# Patient Record
Sex: Female | Born: 1995 | Race: White | Hispanic: No | Marital: Single | Smoking: Current every day smoker
Health system: Southern US, Community
[De-identification: ages and names within clinical notes are randomized; demographics above are authoritative.]

## PROBLEM LIST (undated history)

## (undated) DIAGNOSIS — Z3A27 27 weeks gestation of pregnancy: Secondary | ICD-10-CM

## (undated) DIAGNOSIS — J302 Other seasonal allergic rhinitis: Secondary | ICD-10-CM

## (undated) DIAGNOSIS — F319 Bipolar disorder, unspecified: Secondary | ICD-10-CM

## (undated) DIAGNOSIS — F329 Major depressive disorder, single episode, unspecified: Secondary | ICD-10-CM

## (undated) DIAGNOSIS — F32A Depression, unspecified: Secondary | ICD-10-CM

## (undated) HISTORY — PX: MYRINGOTOMY: SUR874

## (undated) HISTORY — PX: NO PAST SURGERIES: SHX2092

---
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
## 2016-05-13 ENCOUNTER — Encounter: Payer: Self-pay | Admitting: Intensive Care

## 2016-05-13 ENCOUNTER — Emergency Department
Admission: EM | Admit: 2016-05-13 | Discharge: 2016-05-13 | Disposition: A | Discharge status: Home / Self Care | Payer: Medicaid Other | Attending: Emergency Medicine | Admitting: Emergency Medicine

## 2016-05-13 DIAGNOSIS — F17210 Nicotine dependence, cigarettes, uncomplicated: Secondary | ICD-10-CM | POA: Diagnosis not present

## 2016-05-13 DIAGNOSIS — N390 Urinary tract infection, site not specified: Secondary | ICD-10-CM | POA: Insufficient documentation

## 2016-05-13 DIAGNOSIS — R1031 Right lower quadrant pain: Secondary | ICD-10-CM

## 2016-05-13 LAB — COMPREHENSIVE METABOLIC PANEL
ALT: 15 U/L (ref 14–54)
ANION GAP: 6 (ref 5–15)
AST: 22 U/L (ref 15–41)
Albumin: 4.2 g/dL (ref 3.5–5.0)
Alkaline Phosphatase: 69 U/L (ref 38–126)
BUN: 14 mg/dL (ref 6–20)
CHLORIDE: 108 mmol/L (ref 101–111)
CO2: 26 mmol/L (ref 22–32)
Calcium: 9.4 mg/dL (ref 8.9–10.3)
Creatinine, Ser: 0.85 mg/dL (ref 0.44–1.00)
GFR calc Af Amer: >60 mL/min (ref >60)
Glucose, Bld: 88 mg/dL (ref 65–99)
POTASSIUM: 4 mmol/L (ref 3.5–5.1)
Sodium: 140 mmol/L (ref 135–145)
TOTAL PROTEIN: 7.3 g/dL (ref 6.5–8.1)
Total Bilirubin: 1 mg/dL (ref 0.3–1.2)

## 2016-05-13 LAB — URINALYSIS, COMPLETE (UACMP) WITH MICROSCOPIC
BILIRUBIN URINE: NEGATIVE
Glucose, UA: NEGATIVE mg/dL
Hgb urine dipstick: NEGATIVE
KETONES UR: NEGATIVE mg/dL
NITRITE: NEGATIVE
PROTEIN: NEGATIVE mg/dL
Specific Gravity, Urine: 1.027 (ref 1.005–1.030)
pH: 5 (ref 5.0–8.0)

## 2016-05-13 LAB — CBC
HCT: 42.6 % (ref 35.0–47.0)
HEMOGLOBIN: 14.7 g/dL (ref 12.0–16.0)
MCH: 32.9 pg (ref 26.0–34.0)
MCHC: 34.4 g/dL (ref 32.0–36.0)
MCV: 95.6 fL (ref 80.0–100.0)
Platelets: 217 10*3/uL (ref 150–440)
RBC: 4.45 MIL/uL (ref 3.80–5.20)
RDW: 12.8 % (ref 11.5–14.5)
WBC: 8.9 10*3/uL (ref 3.6–11.0)

## 2016-05-13 LAB — POCT PREGNANCY, URINE: PREG TEST UR: NEGATIVE

## 2016-05-13 LAB — LIPASE, BLOOD: LIPASE: 24 U/L (ref 11–51)

## 2016-05-13 MED ORDER — NITROFURANTOIN MONOHYD MACRO 100 MG PO CAPS: 100.0000 mg | ORAL_CAPSULE | Freq: Two times a day (BID) | ORAL | 0 refills | Status: AC

## 2016-05-13 NOTE — ED Notes (Signed)
Pt discharged home after verbalizing understanding of discharge instructions; nad noted. 

## 2016-05-13 NOTE — Discharge Instructions (Signed)
Please seek medical attention for any high fevers, chest pain, shortness of breath, change in behavior, persistent vomiting, bloody stool or any other new or concerning symptoms.  

## 2016-05-13 NOTE — ED Triage Notes (Signed)
Patient presents to ER with R lower abdominal pain. Pt reports she has HX of cyst in that area and believes it is the same pain as when she was first diagnosed with a cyst. Ambulatory in triage with no problems. No acute distress noted

## 2016-05-13 NOTE — ED Provider Notes (Signed)
[facility] Emergency Department Provider Note    ____________________________________________   I have reviewed the triage vital signs and the nursing notes.   HISTORY  Chief Complaint Abdominal Pain   History limited by: Not Limited   HPI Theresa Fischer is a 21 y.o. female who presents to the emergency department today cousins of concerns for right ovary pain. The patient states that it started about a week ago. Has been fairly constant but became severe today. It is located in her right lower quadrant. She has had some associated nausea. No change in urination. Patient states she thinks it's her ovary because it reminds her of the time she had an ovarian cyst roughly 3 years ago. The patient has not had any fevers.    Past Medical History:  Diagnosis Date  . Bipolar 1 disorder (HCC)   . Depression    Bipolar  . Seasonal allergies     Patient Active Problem List   Diagnosis Date Noted  . Postpartum care following vaginal delivery 02/16/2015  . Labor and delivery indication for care or intervention 02/12/2015  . Indication for care in labor and delivery, antepartum 02/12/2015  . Labor and delivery, indication for care 02/12/2015  . Abnormal first trimester screen 09/30/2014  . Seasonal allergies   . Depression 12/10/2011  . Suicidal thoughts 12/10/2011  . Substance abuse 12/10/2011    Past Surgical History:  Procedure Laterality Date  . MYRINGOTOMY    . NO PAST SURGERIES      Prior to Admission medications   Medication Sig Start Date End Date Taking? Authorizing Provider  baclofen (LIORESAL) 10 MG tablet Take 1 tablet (10 mg total) by mouth 3 (three) times daily. 08/20/15   Beers, Charmayne Sheerharles M, PA-C  butalbital-acetaminophen-caffeine (FIORICET) [phone] MG tablet Take 1-2 tablets by mouth every 6 (six) hours as needed for headache. 08/20/15   Beers, Charmayne Sheerharles M, PA-C  meloxicam (MOBIC) 15 MG tablet Take 1 tablet (15 mg total) by mouth daily.  08/20/15   Beers, Charmayne Sheerharles M, PA-C  Prenatal Vit-Fe Fumarate-FA (PRENATAL MULTIVITAMIN) TABS tablet Take 1 tablet by mouth daily at 12 noon.    [provider]    Allergies Patient has no known allergies.  Family History  Problem Relation Age of Onset  . CAD Mother     Social History Social History  Substance Use Topics  . Smoking status: Current Every Day Smoker    Packs/day: 0.10    Types: Cigarettes  . Smokeless tobacco: Never Used  . Alcohol use Yes     Comment: occ    Review of Systems Constitutional: No fever/chills Eyes: No visual changes. ENT: No sore throat. Cardiovascular: Denies chest pain. Respiratory: Denies shortness of breath. Gastrointestinal: Positive for right lower quadrant pain Genitourinary: Negative for dysuria. Musculoskeletal: Negative for back pain. Neurological: Negative for headaches, focal weakness or numbness.  ____________________________________________   PHYSICAL EXAM:  VITAL SIGNS: ED Triage Vitals  Enc Vitals Group     BP 05/13/16 1156 133/68     Pulse Rate 05/13/16 1156 92     Resp 05/13/16 1156 14     Temp 05/13/16 1156 97.9 F (36.6 C)     Temp Source 05/13/16 1156 Oral     SpO2 05/13/16 1156 99 %     Weight 05/13/16 1154 108 lb (49 kg)     Height 05/13/16 1154 5\' 2"  (1.575 m)     Head Circumference --      Peak Flow --  Pain Score 05/13/16 1154 9   Constitutional: Alert and oriented. Well appearing and in no distress. Eyes: Conjunctivae are normal. Normal extraocular movements. ENT   Head: Normocephalic and atraumatic.   Nose: No congestion/rhinnorhea.   Mouth/Throat: Mucous membranes are moist.   Neck: No stridor. Hematological/Lymphatic/Immunilogical: No cervical lymphadenopathy. Cardiovascular: Normal rate, regular rhythm.  No murmurs, rubs, or gallops.  Respiratory: Normal respiratory effort without tachypnea nor retractions. Breath sounds are clear and equal bilaterally. No  wheezes/rales/rhonchi. Gastrointestinal: Soft and tender to palpation in the right lower quadrant. No rebound. No guarding.  Genitourinary: Deferred Musculoskeletal: Normal range of motion in all extremities. No lower extremity edema. Neurologic:  Normal speech and language. No gross focal neurologic deficits are appreciated.  Skin:  Skin is warm, dry and intact. No rash noted. Psychiatric: Mood and affect are normal. Speech and behavior are normal. Patient exhibits appropriate insight and judgment.  ____________________________________________    LABS (pertinent positives/negatives)  Labs Reviewed  URINALYSIS, COMPLETE (UACMP) WITH MICROSCOPIC - Abnormal; Notable for the following:       Result Value   Color, Urine YELLOW (*)    APPearance CLOUDY (*)    Leukocytes, UA MODERATE (*)    Bacteria, UA RARE (*)    Squamous Epithelial / LPF 6-30 (*)    All other components within normal limits  LIPASE, BLOOD  COMPREHENSIVE METABOLIC PANEL  CBC  POC URINE PREG, ED  POCT PREGNANCY, URINE      ____________________________________________   EKG  None  ____________________________________________    RADIOLOGY  None   ____________________________________________   PROCEDURES  Procedures  ____________________________________________   INITIAL IMPRESSION / ASSESSMENT AND PLAN / ED COURSE  Pertinent labs & imaging results that were available during my care of the patient were reviewed by me and considered in my medical decision making (see chart for details).  Patient presented to the emergency department today because of concerns for right lower quadrant pain in relation to her ovary. Patient's blood work without any concerning findings. Urine is suggestive of a urinary tract infection. I did offer to obtain an ultrasound for the patient however she stated that she did not want to "spend all Mother's Day here." I splinted the patient that I cannot tell her it is a cyst  versus a different etiology like torsion without the ultrasound. Patient felt comfortable going home without imaging. At this point I would have low suspicion for torsion or tubo-ovarian abscess. Additionally I have low suspicion for appendicitis given lack of fever or leukocytosis. Will plan on treating patient for possible UTI. Did discuss return precautions. ____________________________________________   FINAL CLINICAL IMPRESSION(S) / ED DIAGNOSES  Final diagnoses:  Lower urinary tract infectious disease  Right lower quadrant abdominal pain     Note: This dictation was prepared with Dragon dictation. Any transcriptional errors that result from this process are unintentional     Phineas Semen, MD 05/13/16 1416

## 2016-05-13 NOTE — ED Notes (Signed)
EDP at bedside. Pt reports right sided abdominal pain x 1 week. She states that she had an ovarian cyst a few years ago and this feels similar. Pt denies urinary symptoms. She also declines ultrasound because "I'm not trying to spend my whole mother's day here." Pt agrees to take antibiotics for uti and be discharged.

## 2016-07-02 ENCOUNTER — Ambulatory Visit (INDEPENDENT_AMBULATORY_CARE_PROVIDER_SITE_OTHER): Payer: Medicaid Other | Admitting: Advanced Practice Midwife

## 2016-07-02 ENCOUNTER — Encounter: Payer: Self-pay | Admitting: Advanced Practice Midwife

## 2016-07-02 VITALS — BP 110/70 | Ht 62.0 in | Wt 118.0 lb

## 2016-07-02 DIAGNOSIS — M25551 Pain in right hip: Secondary | ICD-10-CM

## 2016-07-02 DIAGNOSIS — Z01419 Encounter for gynecological examination (general) (routine) without abnormal findings: Secondary | ICD-10-CM

## 2016-07-02 DIAGNOSIS — Z3042 Encounter for surveillance of injectable contraceptive: Secondary | ICD-10-CM

## 2016-07-02 DIAGNOSIS — Z0000 Encounter for general adult medical examination without abnormal findings: Secondary | ICD-10-CM

## 2016-07-02 MED ORDER — MEDROXYPROGESTERONE ACETATE 150 MG/ML IM SUSP: 150.0000 mg | INTRAMUSCULAR | 3 refills | Status: DC

## 2016-07-02 NOTE — Progress Notes (Signed)
Patient ID: Barbaraann Share, female   DOB: 1995/04/01, 21 y.o.   MRN: [id number]     Gynecology Annual Exam  PCP: [place] Bing, MD  Chief Complaint:  Chief Complaint  Patient presents with  . Annual Exam    History of Present Illness: Patient is a 21 y.o. [id number] presents for annual exam. The patient has complaints today of RLQ pain that began in the past year and is constant. The pain is worse with intercourse. She thinks the pain is focused on her right ovary. She is not currently taking any medicine for anxiety or depression but she is seeing a therapist at [facility] given her history of depression.  LMP: Patient's last menstrual period was 06/25/2016. Menarche:not applicable Average Interval: irregular, not applicable days. Has had 1 period since being on Depo Duration of flow: 14 days Heavy Menses: yes Clots: no Intermenstrual Bleeding: no Postcoital Bleeding: no Dysmenorrhea: no  The patient is sexually active. She currently uses Depo-Provera injections for contraception. She admits to dyspareunia.  The patient does perform self breast exams.  There is no notable family history of breast or ovarian cancer in her family.  The patient wears seatbelts: yes.  The patient has regular exercise: yes.    The patient denies current symptoms of depression.    Review of Systems: Review of Systems  Constitutional: Negative.   HENT: Negative.   Eyes: Negative.   Respiratory: Negative.   Cardiovascular: Negative.   Gastrointestinal: Negative.   Genitourinary: Negative.   Musculoskeletal: Negative.   Skin: Negative.   Neurological: Negative.   Endo/Heme/Allergies: Negative.   Psychiatric/Behavioral: Negative.     Past Medical History:  Past Medical History:  Diagnosis Date  . Bipolar 1 disorder (HCC)   . Depression    Bipolar  . Seasonal allergies     Past Surgical History:  Past Surgical History:  Procedure Laterality Date  . MYRINGOTOMY    . NO PAST SURGERIES       Gynecologic History:  Patient's last menstrual period was 06/25/2016. Contraception: Depo-Provera injections Last Pap: Results were: Has not yet had a PAP  Obstetric History: [id number]  Family History:  Family History  Problem Relation Age of Onset  . CAD Mother     Social History:  Social History   Social History  . Marital status: Single    Spouse name: N/A  . Number of children: N/A  . Years of education: N/A   Occupational History  . Not on file.   Social History Main Topics  . Smoking status: Current Every Day Smoker    Packs/day: 0.10    Types: Cigarettes  . Smokeless tobacco: Never Used  . Alcohol use Yes     Comment: occ  . Drug use: Yes    Types: Marijuana     Comment: Last use August 2016  . Sexual activity: Yes    Birth control/ protection: Injection     Comment: depo   Other Topics Concern  . Not on file   Social History Narrative  . No narrative on file    Allergies:  No Known Allergies  Medications: Prior to Admission medications   Medication Sig Start Date End Date Taking? Authorizing Provider  medroxyPROGESTERone (DEPO-PROVERA) 150 MG/ML injection Inject 1 mL (150 mg total) into the muscle every 3 (three) months. 07/02/16  Yes Tresea Mall, CNM  baclofen (LIORESAL) 10 MG tablet Take 1 tablet (10 mg total) by mouth 3 (three) times daily. Patient not taking: Reported on 07/02/2016  08/20/15   Beers, Charmayne Sheerharles M, PA-C  butalbital-acetaminophen-caffeine (FIORICET) [phone] MG tablet Take 1-2 tablets by mouth every 6 (six) hours as needed for headache. Patient not taking: Reported on 07/02/2016 08/20/15   Beers, Charmayne Sheerharles M, PA-C  meloxicam (MOBIC) 15 MG tablet Take 1 tablet (15 mg total) by mouth daily. Patient not taking: Reported on 07/02/2016 08/20/15   Beers, Charmayne Sheerharles M, PA-C  Prenatal Vit-Fe Fumarate-FA (PRENATAL MULTIVITAMIN) TABS tablet Take 1 tablet by mouth daily at 12 noon.    [provider]    Physical Exam Vitals: Blood pressure  110/70, height 5\' 2"  (1.575 m), weight 118 lb (53.5 kg), last menstrual period 06/25/2016, not currently breastfeeding.  General: NAD HEENT: normocephalic, anicteric Thyroid: no enlargement, no palpable nodules Pulmonary: No increased work of breathing, CTAB Cardiovascular: RRR, distal pulses 2+ Breast: Breast symmetrical, no tenderness, no palpable nodules or masses, no skin or nipple retraction present, no nipple discharge.  No axillary or supraclavicular lymphadenopathy. Abdomen: NABS, soft, non-tender, non-distended.  Umbilicus without lesions.  No hepatomegaly, splenomegaly or masses palpable. No evidence of hernia  Genitourinary:  External: Normal external female genitalia.  Normal urethral meatus, normal  Bartholin's and Skene's glands.    Vagina: Normal vaginal mucosa, no evidence of prolapse.    Cervix: Grossly normal in appearance, no bleeding, no CMT  Uterus: Non-enlarged, mobile, normal contour.    Adnexa: ovaries non-enlarged, no adnexal masses, increased tenderness on R side  Rectal: deferred  Lymphatic: no evidence of inguinal lymphadenopathy Extremities: no edema, erythema, or tenderness Neurologic: Grossly intact Psychiatric: mood appropriate, affect full   Assessment: 21 y.o. [id number] Well woman exam Plan: Problem List Items Addressed This Visit    None    Visit Diagnoses    Pain in joint involving right pelvic region and thigh    -  Primary   Relevant Orders   US Transvaginal Non-OB   Encounter for surveillance of injectable contraceptive       Relevant Medications   medroxyPROGESTERone (DEPO-PROVERA) 150 MG/ML injection   Well woman exam with routine gynecological exam          1) 4) Gardasil Series discussed and if applicable offered to patient - Patient has not previously completed 3 shot series and requests vaccination  2) STI screening was offered and declined   3) ASCCP guidelines and rational discussed.  Patient opts for beginning at age 21  screening interval  4) Contraception - Education given regarding options for contraception. Patient prefers to continue with Depo for now but is considering Nexplanon   5) Follow up gyn u/s for RLQ pain   Tresea MallJane Jovanni Rash, CNM

## 2016-07-10 ENCOUNTER — Other Ambulatory Visit: Payer: Medicaid Other

## 2016-07-10 ENCOUNTER — Ambulatory Visit: Payer: Medicaid Other | Admitting: Advanced Practice Midwife

## 2016-09-02 ENCOUNTER — Encounter: Payer: Self-pay | Admitting: Emergency Medicine

## 2016-09-02 DIAGNOSIS — N390 Urinary tract infection, site not specified: Secondary | ICD-10-CM | POA: Diagnosis not present

## 2016-09-02 DIAGNOSIS — B9689 Other specified bacterial agents as the cause of diseases classified elsewhere: Secondary | ICD-10-CM | POA: Diagnosis not present

## 2016-09-02 DIAGNOSIS — R102 Pelvic and perineal pain: Secondary | ICD-10-CM | POA: Insufficient documentation

## 2016-09-02 DIAGNOSIS — F17210 Nicotine dependence, cigarettes, uncomplicated: Secondary | ICD-10-CM | POA: Diagnosis not present

## 2016-09-02 DIAGNOSIS — N760 Acute vaginitis: Secondary | ICD-10-CM | POA: Insufficient documentation

## 2016-09-02 LAB — COMPREHENSIVE METABOLIC PANEL
ALBUMIN: 4.4 g/dL (ref 3.5–5.0)
ALT: 14 U/L (ref 14–54)
AST: 19 U/L (ref 15–41)
Alkaline Phosphatase: 71 U/L (ref 38–126)
Anion gap: 7 (ref 5–15)
BUN: 13 mg/dL (ref 6–20)
CHLORIDE: 105 mmol/L (ref 101–111)
CO2: 27 mmol/L (ref 22–32)
CREATININE: 0.71 mg/dL (ref 0.44–1.00)
Calcium: 9.3 mg/dL (ref 8.9–10.3)
GFR calc Af Amer: >60 mL/min (ref >60)
GFR calc non Af Amer: >60 mL/min (ref >60)
GLUCOSE: 78 mg/dL (ref 65–99)
Potassium: 3.9 mmol/L (ref 3.5–5.1)
SODIUM: 139 mmol/L (ref 135–145)
Total Bilirubin: 0.7 mg/dL (ref 0.3–1.2)
Total Protein: 7.4 g/dL (ref 6.5–8.1)

## 2016-09-02 LAB — URINALYSIS, COMPLETE (UACMP) WITH MICROSCOPIC
BACTERIA UA: NONE SEEN
BILIRUBIN URINE: NEGATIVE
GLUCOSE, UA: NEGATIVE mg/dL
Hgb urine dipstick: NEGATIVE
KETONES UR: NEGATIVE mg/dL
NITRITE: NEGATIVE
PH: 6 (ref 5.0–8.0)
PROTEIN: NEGATIVE mg/dL
Specific Gravity, Urine: 1.018 (ref 1.005–1.030)

## 2016-09-02 LAB — CBC
HCT: 41.7 % (ref 35.0–47.0)
Hemoglobin: 14.6 g/dL (ref 12.0–16.0)
MCH: 33.7 pg (ref 26.0–34.0)
MCHC: 35 g/dL (ref 32.0–36.0)
MCV: 96.4 fL (ref 80.0–100.0)
Platelets: 206 10*3/uL (ref 150–440)
RBC: 4.33 MIL/uL (ref 3.80–5.20)
RDW: 12.5 % (ref 11.5–14.5)
WBC: 7.9 10*3/uL (ref 3.6–11.0)

## 2016-09-02 LAB — POCT PREGNANCY, URINE: Preg Test, Ur: NEGATIVE

## 2016-09-02 LAB — LIPASE, BLOOD: LIPASE: 27 U/L (ref 11–51)

## 2016-09-02 NOTE — ED Triage Notes (Signed)
Patient with complaint of right lower abdominal pain that started 2 days ago. Patient states that she has some nausea but denies vomiting.

## 2016-09-03 ENCOUNTER — Emergency Department
Admission: EM | Admit: 2016-09-03 | Discharge: 2016-09-03 | Disposition: A | Discharge status: Home / Self Care | Payer: Medicaid Other | Attending: Emergency Medicine | Admitting: Emergency Medicine

## 2016-09-03 ENCOUNTER — Emergency Department: Payer: Medicaid Other

## 2016-09-03 DIAGNOSIS — B9689 Other specified bacterial agents as the cause of diseases classified elsewhere: Secondary | ICD-10-CM

## 2016-09-03 DIAGNOSIS — N760 Acute vaginitis: Secondary | ICD-10-CM

## 2016-09-03 DIAGNOSIS — R52 Pain, unspecified: Secondary | ICD-10-CM

## 2016-09-03 DIAGNOSIS — N390 Urinary tract infection, site not specified: Secondary | ICD-10-CM

## 2016-09-03 DIAGNOSIS — R102 Pelvic and perineal pain: Secondary | ICD-10-CM

## 2016-09-03 LAB — CHLAMYDIA/NGC RT PCR (ARMC ONLY)
CHLAMYDIA TR: NOT DETECTED
N GONORRHOEAE: NOT DETECTED

## 2016-09-03 LAB — WET PREP, GENITAL
Sperm: NONE SEEN
Trich, Wet Prep: NONE SEEN
Yeast Wet Prep HPF POC: NONE SEEN

## 2016-09-03 IMAGING — US US PELVIS COMPLETE
1 series · 14 of 25 positions shown · non-contrast
Comparison: None.

CLINICAL DATA: RIGHT ADNEXAL PAIN FOR 2 DAYS

EXAM:
TRANSABDOMINAL AND TRANSVAGINAL ULTRASOUND OF PELVIS
DOPPLER ULTRASOUND OF OVARIES
TECHNIQUE: Both transabdominal and transvaginal ultrasound examinations of the
pelvis were performed. Transabdominal technique was performed for
global imaging of the pelvis including uterus, ovaries, adnexal
regions, and pelvic cul-de-sac.
It was necessary to proceed with endovaginal exam following the
transabdominal exam to visualize the ovaries. Color and duplex
Doppler ultrasound was utilized to evaluate blood flow to the
ovaries.

[Series 1: us pelvis complete · 0.24mm/px · 14 of 92 slices shown]
[im 1/92]
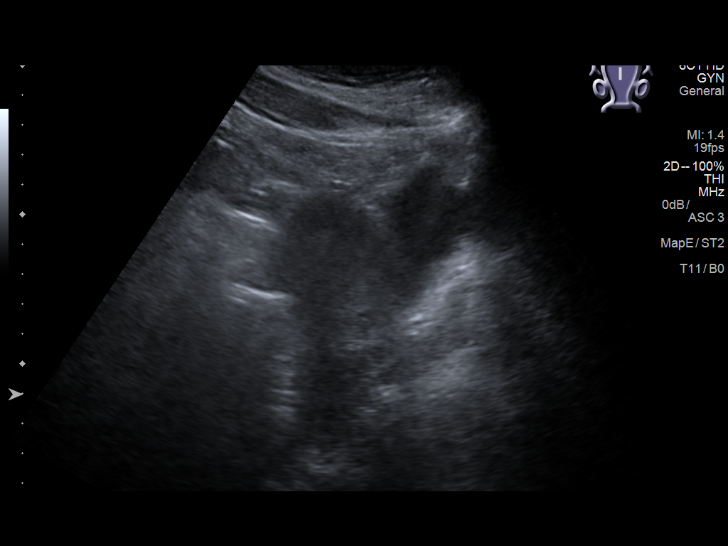
[im 8/92]
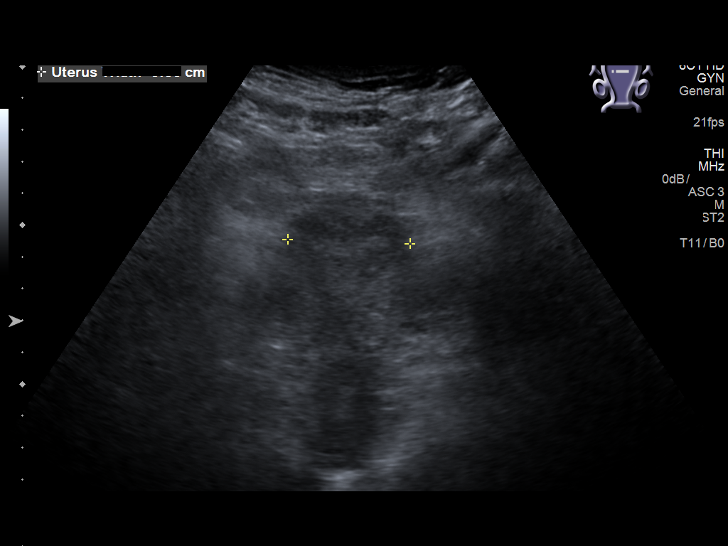
[im 16/92]
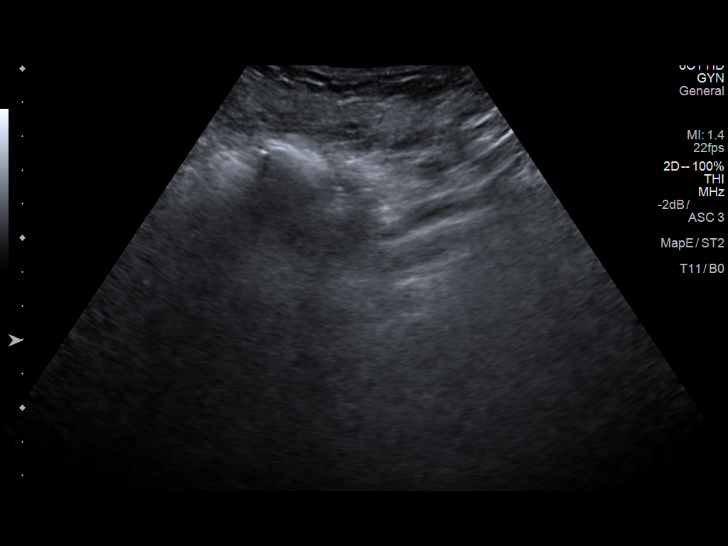
[im 23/92]
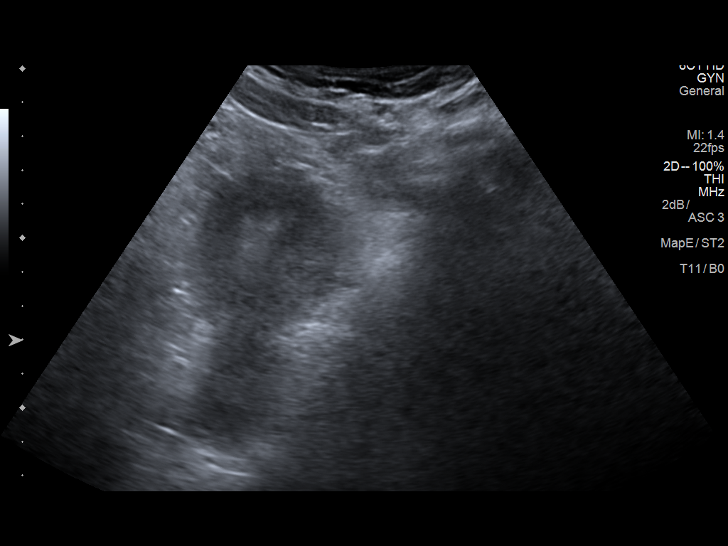
[im 31/92]
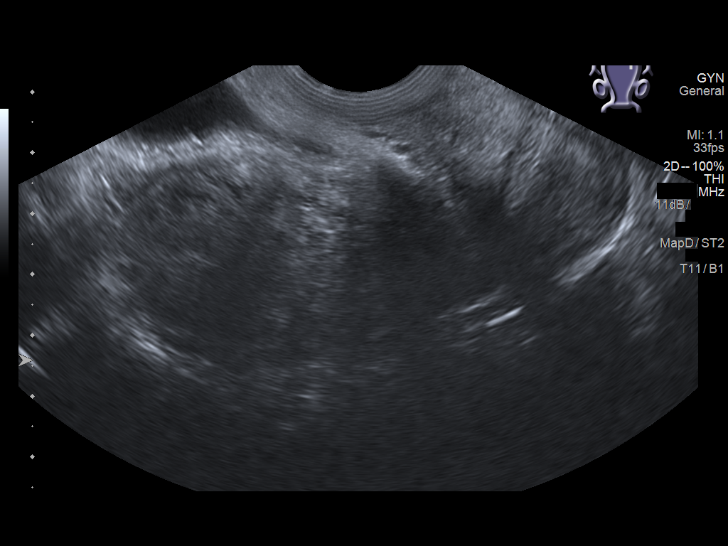
[im 35/92]
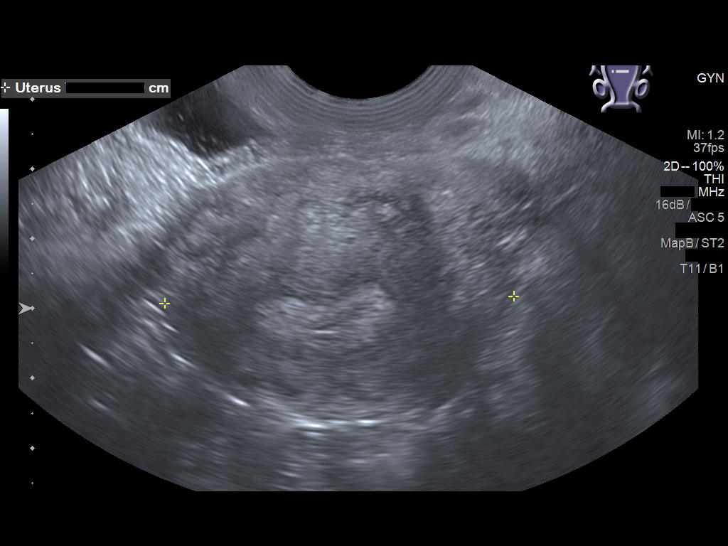
[im 42/92]
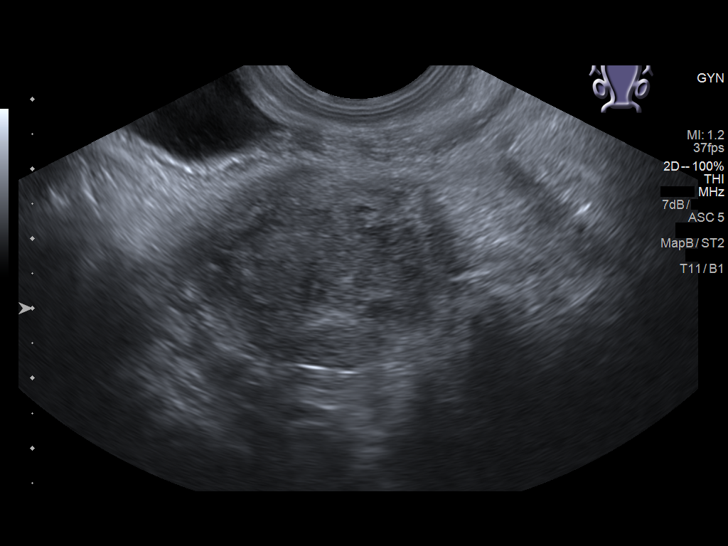
[im 50/92]
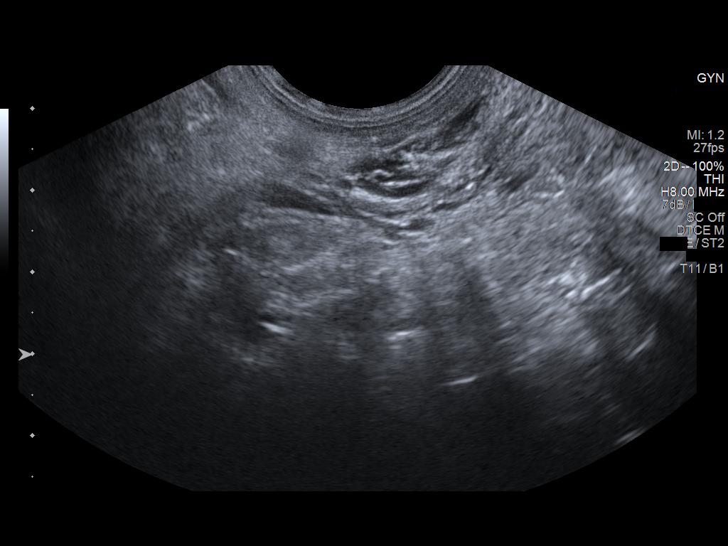
[im 57/92]
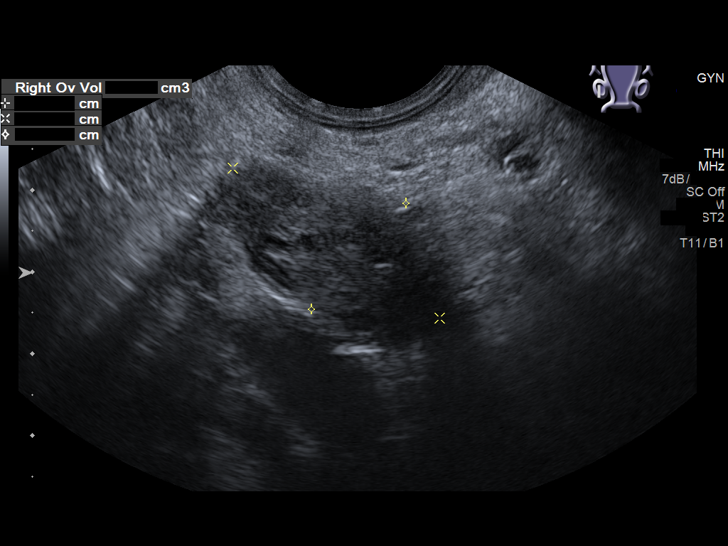
[im 61/92]
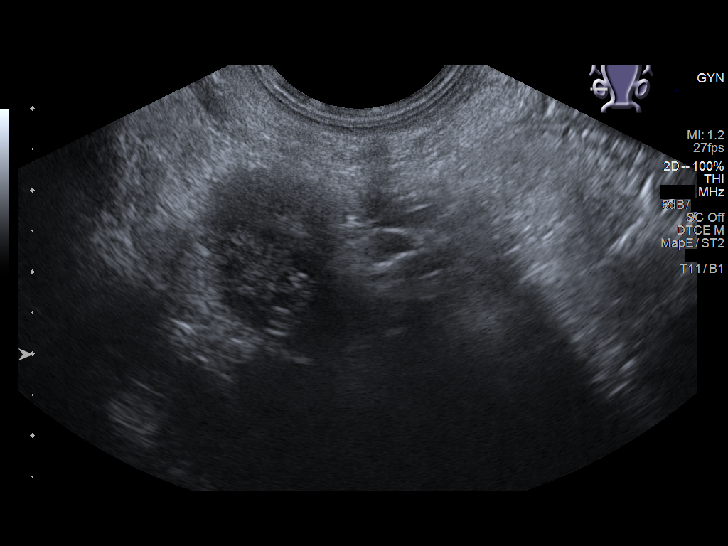
[im 69/92]
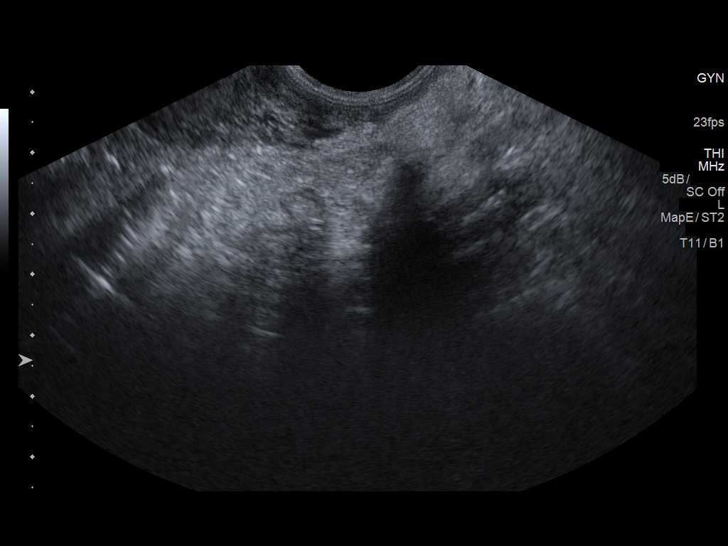
[im 76/92]
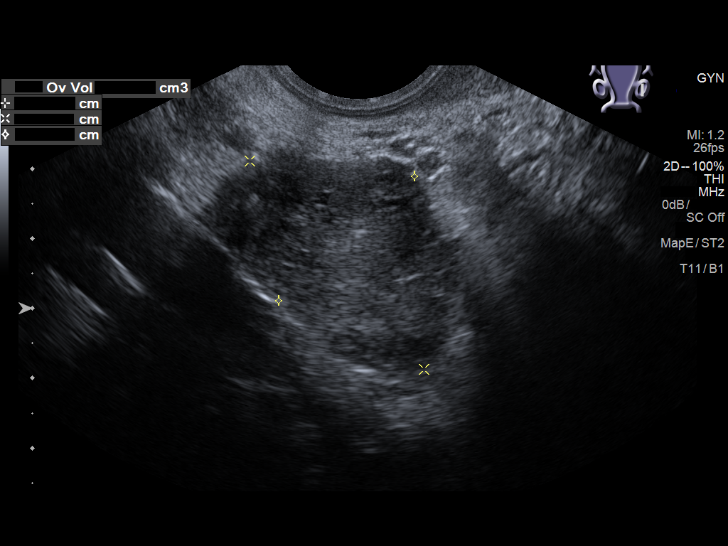
[im 84/92]
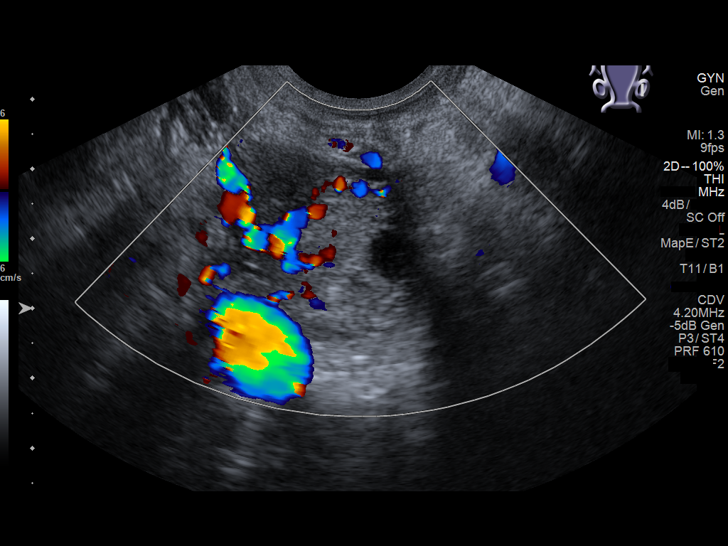
[im 92/92]
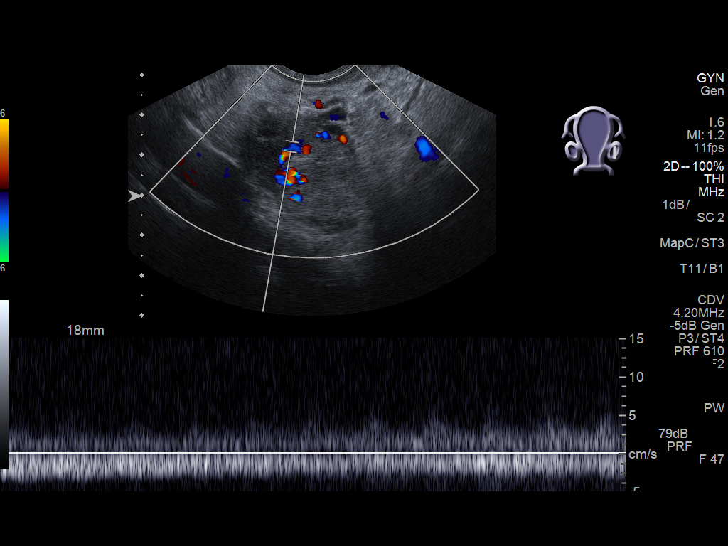

[14 of 25 positions shown; findings below may reference images not displayed]

FINDINGS: Uterus

Measurements: 7.5 x 3.8 x 5.0 cm. No fibroids or other mass
visualized.

Endometrium

Thickness: 6.8 mm.  No focal abnormality visualized.

Right ovary

Measurements: 3.1 x 1.7 x 1.5 cm. Normal appearance/no adnexal mass.

Left ovary

Measurements: 3.9 x 2.6 x 2.2 cm. Normal appearance/no adnexal mass.

Pulsed Doppler evaluation of both ovaries demonstrates normal
low-resistance arterial and venous waveforms.

Other findings

Trace free pelvic fluid.
IMPRESSION: Normal uterus and ovaries.  Intact ovarian perfusion on Doppler.

## 2016-09-03 MED ORDER — METRONIDAZOLE 500 MG PO TABS: 500.0000 mg | ORAL_TABLET | Freq: Two times a day (BID) | ORAL | 0 refills | Status: DC

## 2016-09-03 MED ORDER — FOSFOMYCIN TROMETHAMINE 3 G PO PACK
3.0000 g | PACK | Freq: Once | ORAL | Status: AC
  Administered 2016-09-03: 3 g via ORAL
  Filled 2016-09-03: qty 3

## 2016-09-03 NOTE — ED Notes (Signed)
Pt back to room from us.

## 2016-09-03 NOTE — Discharge Instructions (Signed)
1. You were given a 1 time dose of antibiotic in the emergency department for your UTI. 2. Take antibiotic as prescribed (Flagyl 500 mg twice daily 7 days). 3. Return to the ER for worsening symptoms, persistent vomiting, fever or other concerns.

## 2016-09-03 NOTE — ED Notes (Signed)
Pt taken to us.

## 2016-09-03 NOTE — ED Provider Notes (Signed)
[facility] Emergency Department Provider Note   ____________________________________________   First MD Initiated Contact with Patient 09/03/16 0041     (approximate)  I have reviewed the triage vital signs and the nursing notes.   HISTORY  Chief Complaint Abdominal Pain    HPI Theresa Fischer is a 21 y.o. female who presents to the ED from home with a chief complaint of right pelvic pain. States it has been going on for "a while". When pressed for time frame, patient thinks it has been over a week. Describes pressure-like sensation which is nonradiating and mostly at nighttime. Notes vaginal discharge starting yesterday. Last Depakote shot due on July 5 which patient did not receive. Denies associated fever, chills, chest pain, shortness of breath, abdominal pain, nausea, vomiting, dysuria, diarrhea. Deniesrecent travel or trauma. Patient has not taken anything for her pain.   Past Medical History:  Diagnosis Date  . Bipolar 1 disorder (HCC)   . Depression    Bipolar  . Seasonal allergies   right ovarian cyst  Patient Active Problem List   Diagnosis Date Noted  . Seasonal allergies   . Depression 12/10/2011  . Suicidal thoughts 12/10/2011  . Substance abuse 12/10/2011    Past Surgical History:  Procedure Laterality Date  . MYRINGOTOMY    . NO PAST SURGERIES      Prior to Admission medications   Medication Sig Start Date End Date Taking? Authorizing Provider  baclofen (LIORESAL) 10 MG tablet Take 1 tablet (10 mg total) by mouth 3 (three) times daily. Patient not taking: Reported on 07/02/2016 08/20/15   Beers, Charmayne Sheer, PA-C  butalbital-acetaminophen-caffeine (FIORICET) [phone] MG tablet Take 1-2 tablets by mouth every 6 (six) hours as needed for headache. Patient not taking: Reported on 07/02/2016 08/20/15   Evangeline Dakin, PA-C  medroxyPROGESTERone (DEPO-PROVERA) 150 MG/ML injection Inject 1 mL (150 mg total) into the muscle every 3  (three) months. 07/02/16   Tresea Mall, CNM  meloxicam (MOBIC) 15 MG tablet Take 1 tablet (15 mg total) by mouth daily. Patient not taking: Reported on 07/02/2016 08/20/15   Evangeline Dakin, PA-C  metroNIDAZOLE (FLAGYL) 500 MG tablet Take 1 tablet (500 mg total) by mouth 2 (two) times daily. 09/03/16   Irean Hong, MD  Prenatal Vit-Fe Fumarate-FA (PRENATAL MULTIVITAMIN) TABS tablet Take 1 tablet by mouth daily at 12 noon.    [provider]    Allergies Patient has no known allergies.  Family History  Problem Relation Age of Onset  . CAD Mother     Social History Social History  Substance Use Topics  . Smoking status: Current Every Day Smoker    Packs/day: 0.10    Types: Cigarettes  . Smokeless tobacco: Never Used  . Alcohol use Yes     Comment: occ    Review of Systems  Constitutional: No fever/chills. Eyes: No visual changes. ENT: No sore throat. Cardiovascular: Denies chest pain. Respiratory: Denies shortness of breath. Gastrointestinal: positive for right pelvic pain. No abdominal pain.  No nausea, no vomiting.  No diarrhea.  No constipation. Genitourinary: Negative for dysuria. Musculoskeletal: Negative for back pain. Skin: Negative for rash. Neurological: Negative for headaches, focal weakness or numbness.   ____________________________________________   PHYSICAL EXAM:  VITAL SIGNS: ED Triage Vitals [09/02/16 2327]  Enc Vitals Group     BP 129/66     Pulse Rate 91     Resp 18     Temp 98 F (36.7 C)  Temp src      SpO2 100 %     Weight 128 lb (58.1 kg)     Height 5\' 3"  (1.6 m)     Head Circumference      Peak Flow      Pain Score 5     Pain Loc      Pain Edu?      Excl. in GC?     Constitutional: Alert and oriented. Well appearing and in no acute distress. Eyes: Conjunctivae are normal. PERRL. EOMI. Head: Atraumatic. Nose: No congestion/rhinnorhea. Mouth/Throat: Mucous membranes are moist.  Oropharynx non-erythematous. Neck: No  stridor.   Cardiovascular: Normal rate, regular rhythm. Grossly normal heart sounds.  Good peripheral circulation. Respiratory: Normal respiratory effort.  No retractions. Lungs CTAB. Gastrointestinal: Soft and nontender to light or deep palpation. No tenderness at McBurney's point. No distention. No abdominal bruits. No CVA tenderness. Musculoskeletal: No lower extremity tenderness nor edema.  No joint effusions. Neurologic:  Normal speech and language. No gross focal neurologic deficits are appreciated. No gait instability. Skin:  Skin is warm, dry and intact. No rash noted. Psychiatric: Mood and affect are normal. Speech and behavior are normal.  ____________________________________________   LABS (all labs ordered are listed, but only abnormal results are displayed)  Labs Reviewed  WET PREP, GENITAL - Abnormal; Notable for the following:       Result Value   Clue Cells Wet Prep HPF POC PRESENT (*)    WBC, Wet Prep HPF POC MANY (*)    All other components within normal limits  URINALYSIS, COMPLETE (UACMP) WITH MICROSCOPIC - Abnormal; Notable for the following:    Color, Urine YELLOW (*)    APPearance CLEAR (*)    Leukocytes, UA TRACE (*)    Squamous Epithelial / LPF 0-5 (*)    All other components within normal limits  CHLAMYDIA/NGC RT PCR ([facility] ONLY)  LIPASE, BLOOD  COMPREHENSIVE METABOLIC PANEL  CBC  POC URINE PREG, ED  POCT PREGNANCY, URINE   ____________________________________________  EKG  none ____________________________________________  RADIOLOGY  US Pelvis Transvanginal Non-ob (tv Only)  Result Date: 09/03/2016 CLINICAL DATA:  RIGHT ADNEXAL PAIN FOR 2 DAYS EXAM: TRANSABDOMINAL AND TRANSVAGINAL ULTRASOUND OF PELVIS DOPPLER ULTRASOUND OF OVARIES TECHNIQUE: Both transabdominal and transvaginal ultrasound examinations of the pelvis were performed. Transabdominal technique was performed for global imaging of the pelvis including uterus, ovaries, adnexal  regions, and pelvic cul-de-sac. It was necessary to proceed with endovaginal exam following the transabdominal exam to visualize the ovaries. Color and duplex Doppler ultrasound was utilized to evaluate blood flow to the ovaries. COMPARISON:  None. FINDINGS: Uterus Measurements: 7.5 x 3.8 x 5.0 cm. No fibroids or other mass visualized. Endometrium Thickness: 6.8 mm.  No focal abnormality visualized. Right ovary Measurements: 3.1 x 1.7 x 1.5 cm. Normal appearance/no adnexal mass. Left ovary Measurements: 3.9 x 2.6 x 2.2 cm. Normal appearance/no adnexal mass. Pulsed Doppler evaluation of both ovaries demonstrates normal low-resistance arterial and venous waveforms. Other findings Trace free pelvic fluid. IMPRESSION: Normal uterus and ovaries.  Intact ovarian perfusion on Doppler. Electronically Signed   By: Ellery Plunk M.D.   On: 09/03/2016 02:26   US Pelvis Complete  Result Date: 09/03/2016 CLINICAL DATA:  RIGHT ADNEXAL PAIN FOR 2 DAYS EXAM: TRANSABDOMINAL AND TRANSVAGINAL ULTRASOUND OF PELVIS DOPPLER ULTRASOUND OF OVARIES TECHNIQUE: Both transabdominal and transvaginal ultrasound examinations of the pelvis were performed. Transabdominal technique was performed for global imaging of the pelvis including uterus, ovaries, adnexal regions, and pelvic  cul-de-sac. It was necessary to proceed with endovaginal exam following the transabdominal exam to visualize the ovaries. Color and duplex Doppler ultrasound was utilized to evaluate blood flow to the ovaries. COMPARISON:  None. FINDINGS: Uterus Measurements: 7.5 x 3.8 x 5.0 cm. No fibroids or other mass visualized. Endometrium Thickness: 6.8 mm.  No focal abnormality visualized. Right ovary Measurements: 3.1 x 1.7 x 1.5 cm. Normal appearance/no adnexal mass. Left ovary Measurements: 3.9 x 2.6 x 2.2 cm. Normal appearance/no adnexal mass. Pulsed Doppler evaluation of both ovaries demonstrates normal low-resistance arterial and venous waveforms. Other findings Trace  free pelvic fluid. IMPRESSION: Normal uterus and ovaries.  Intact ovarian perfusion on Doppler. Electronically Signed   By: Ellery Plunk M.D.   On: 09/03/2016 02:26   US Pelvic Doppler (torsion R/o Or Mass Arterial Flow)  Result Date: 09/03/2016 CLINICAL DATA:  RIGHT ADNEXAL PAIN FOR 2 DAYS EXAM: TRANSABDOMINAL AND TRANSVAGINAL ULTRASOUND OF PELVIS DOPPLER ULTRASOUND OF OVARIES TECHNIQUE: Both transabdominal and transvaginal ultrasound examinations of the pelvis were performed. Transabdominal technique was performed for global imaging of the pelvis including uterus, ovaries, adnexal regions, and pelvic cul-de-sac. It was necessary to proceed with endovaginal exam following the transabdominal exam to visualize the ovaries. Color and duplex Doppler ultrasound was utilized to evaluate blood flow to the ovaries. COMPARISON:  None. FINDINGS: Uterus Measurements: 7.5 x 3.8 x 5.0 cm. No fibroids or other mass visualized. Endometrium Thickness: 6.8 mm.  No focal abnormality visualized. Right ovary Measurements: 3.1 x 1.7 x 1.5 cm. Normal appearance/no adnexal mass. Left ovary Measurements: 3.9 x 2.6 x 2.2 cm. Normal appearance/no adnexal mass. Pulsed Doppler evaluation of both ovaries demonstrates normal low-resistance arterial and venous waveforms. Other findings Trace free pelvic fluid. IMPRESSION: Normal uterus and ovaries.  Intact ovarian perfusion on Doppler. Electronically Signed   By: Ellery Plunk M.D.   On: 09/03/2016 02:26    ____________________________________________   PROCEDURES  Procedure(s) performed:   Pelvic exam: External exam WNL without rashes, lesions or vesicles. Speculum exam reveals mild white discharge. Cervix WNL. Bimanual exam with mild cervical tenderness to palpation.  Procedures  Critical Care performed: No  ____________________________________________   INITIAL IMPRESSION / ASSESSMENT AND PLAN / ED COURSE  Pertinent labs & imaging results that were available  during my care of the patient were reviewed by me and considered in my medical decision making (see chart for details).  21 year old female who presents with right pelvic pain for over a week. History of right ovarian cyst.no fever, vomiting or anorexia. Normal white count. Trace leukocytes on urinalysis. Will obtain wet prep/DNA swabs and proceed with pelvic ultrasound.  Clinical Course as of Sep 04 255  Mon Sep 03, 2016  0254 updated patient of wet prep and ultrasound results. DNA negative. We'll discharge home on Flagyl and patient will follow-up with GYN. Strict return precautions given. Patient verbalizes understanding and agrees with plan of care.  [JS]    Clinical Course User Index [JS] Irean Hong, MD     ____________________________________________   FINAL CLINICAL IMPRESSION(S) / ED DIAGNOSES  Final diagnoses:  Pain  Pelvic pain in female  Lower urinary tract infectious disease  Bacterial vaginosis      NEW MEDICATIONS STARTED DURING THIS VISIT:  New Prescriptions   METRONIDAZOLE (FLAGYL) 500 MG TABLET    Take 1 tablet (500 mg total) by mouth 2 (two) times daily.     Note:  This document was prepared using Dragon voice recognition software and may include unintentional dictation  errors.    Irean HongSung, Deryk Bozman J, MD 09/03/16 [phone]

## 2016-09-03 NOTE — ED Notes (Signed)
Pt said she started having right lower abd pain since last week, denies any diarrhea or constipation, stated had BM today and it was regular. Denies any vaginal discharges. Stated unknown LMP, had her last depo injection July 5th.

## 2016-09-24 ENCOUNTER — Other Ambulatory Visit: Payer: Self-pay | Admitting: Family Medicine

## 2016-09-24 ENCOUNTER — Ambulatory Visit
Admission: RE | Admit: 2016-09-24 | Discharge: 2016-09-24 | Disposition: A | Discharge status: Home / Self Care | Payer: Medicaid Other | Source: Ambulatory Visit | Attending: Family Medicine | Admitting: Family Medicine

## 2016-09-24 DIAGNOSIS — O26891 Other specified pregnancy related conditions, first trimester: Secondary | ICD-10-CM | POA: Insufficient documentation

## 2016-09-24 DIAGNOSIS — Z3A01 Less than 8 weeks gestation of pregnancy: Secondary | ICD-10-CM | POA: Insufficient documentation

## 2016-09-24 DIAGNOSIS — R109 Unspecified abdominal pain: Secondary | ICD-10-CM | POA: Diagnosis present

## 2016-09-24 DIAGNOSIS — Z3201 Encounter for pregnancy test, result positive: Secondary | ICD-10-CM

## 2016-10-13 ENCOUNTER — Encounter: Payer: Self-pay | Admitting: Emergency Medicine

## 2016-10-13 ENCOUNTER — Emergency Department
Admission: EM | Admit: 2016-10-13 | Discharge: 2016-10-14 | Disposition: A | Discharge status: Home / Self Care | Payer: Medicaid Other | Attending: Emergency Medicine | Admitting: Emergency Medicine

## 2016-10-13 DIAGNOSIS — N719 Inflammatory disease of uterus, unspecified: Secondary | ICD-10-CM

## 2016-10-13 DIAGNOSIS — R1030 Lower abdominal pain, unspecified: Secondary | ICD-10-CM | POA: Diagnosis present

## 2016-10-13 DIAGNOSIS — F17210 Nicotine dependence, cigarettes, uncomplicated: Secondary | ICD-10-CM | POA: Insufficient documentation

## 2016-10-13 DIAGNOSIS — Z79899 Other long term (current) drug therapy: Secondary | ICD-10-CM | POA: Diagnosis not present

## 2016-10-13 DIAGNOSIS — R102 Pelvic and perineal pain: Secondary | ICD-10-CM | POA: Insufficient documentation

## 2016-10-13 DIAGNOSIS — N809 Endometriosis, unspecified: Secondary | ICD-10-CM | POA: Diagnosis not present

## 2016-10-13 LAB — COMPREHENSIVE METABOLIC PANEL
ALK PHOS: 59 U/L (ref 38–126)
ALT: 9 U/L — ABNORMAL LOW (ref 14–54)
ANION GAP: 7 (ref 5–15)
AST: 17 U/L (ref 15–41)
Albumin: 3.9 g/dL (ref 3.5–5.0)
BILIRUBIN TOTAL: 0.5 mg/dL (ref 0.3–1.2)
BUN: 14 mg/dL (ref 6–20)
CALCIUM: 9.1 mg/dL (ref 8.9–10.3)
CO2: 24 mmol/L (ref 22–32)
Chloride: 106 mmol/L (ref 101–111)
Creatinine, Ser: 0.78 mg/dL (ref 0.44–1.00)
GFR calc Af Amer: >60 mL/min (ref >60)
GLUCOSE: 84 mg/dL (ref 65–99)
POTASSIUM: 4 mmol/L (ref 3.5–5.1)
Sodium: 137 mmol/L (ref 135–145)
TOTAL PROTEIN: 6.6 g/dL (ref 6.5–8.1)

## 2016-10-13 LAB — URINALYSIS, COMPLETE (UACMP) WITH MICROSCOPIC
Bilirubin Urine: NEGATIVE
Glucose, UA: NEGATIVE mg/dL
KETONES UR: NEGATIVE mg/dL
Leukocytes, UA: NEGATIVE
Nitrite: NEGATIVE
PROTEIN: NEGATIVE mg/dL
Specific Gravity, Urine: 1.014 (ref 1.005–1.030)
pH: 5 (ref 5.0–8.0)

## 2016-10-13 LAB — CBC
HEMATOCRIT: 37 % (ref 35.0–47.0)
Hemoglobin: 12.6 g/dL (ref 12.0–16.0)
MCH: 33 pg (ref 26.0–34.0)
MCHC: 33.9 g/dL (ref 32.0–36.0)
MCV: 97.1 fL (ref 80.0–100.0)
Platelets: 205 10*3/uL (ref 150–440)
RBC: 3.82 MIL/uL (ref 3.80–5.20)
RDW: 12.1 % (ref 11.5–14.5)
WBC: 16 10*3/uL — AB (ref 3.6–11.0)

## 2016-10-13 LAB — LIPASE, BLOOD: Lipase: 21 U/L (ref 11–51)

## 2016-10-13 NOTE — ED Triage Notes (Signed)
Pt states that around 12:00 today she had an abortion. Pt states that she woke up about 1-2 hours ago and was having abdominal pain. Pt states that pain is better when she stands up and bends over. Pt in NAD at this time. VSS.

## 2016-10-14 ENCOUNTER — Emergency Department: Payer: Medicaid Other

## 2016-10-14 LAB — HCG, QUANTITATIVE, PREGNANCY: hCG, Beta Chain, Quant, S: 93005 m[IU]/mL — ABNORMAL HIGH (ref <5)

## 2016-10-14 LAB — WET PREP, GENITAL
CLUE CELLS WET PREP: NONE SEEN
Sperm: NONE SEEN
Trich, Wet Prep: NONE SEEN
Yeast Wet Prep HPF POC: NONE SEEN

## 2016-10-14 LAB — CHLAMYDIA/NGC RT PCR (ARMC ONLY)
CHLAMYDIA TR: NOT DETECTED
N GONORRHOEAE: NOT DETECTED

## 2016-10-14 MED ORDER — OXYCODONE-ACETAMINOPHEN 5-325 MG PO TABS
2.0000 | ORAL_TABLET | Freq: Once | ORAL | Status: AC
  Administered 2016-10-14: 2 via ORAL
  Filled 2016-10-14: qty 2

## 2016-10-14 MED ORDER — MORPHINE SULFATE (PF) 4 MG/ML IV SOLN
4.0000 mg | Freq: Once | INTRAVENOUS | Status: AC
  Administered 2016-10-14: 4 mg via INTRAMUSCULAR
  Filled 2016-10-14: qty 1

## 2016-10-14 MED ORDER — METRONIDAZOLE 500 MG PO TABS: 500.0000 mg | ORAL_TABLET | Freq: Two times a day (BID) | ORAL | 0 refills | Status: DC

## 2016-10-14 MED ORDER — ONDANSETRON 4 MG PO TBDP
4.0000 mg | ORAL_TABLET | Freq: Once | ORAL | Status: AC
  Administered 2016-10-14: 4 mg via ORAL
  Filled 2016-10-14: qty 1

## 2016-10-14 MED ORDER — METRONIDAZOLE 500 MG PO TABS
500.0000 mg | ORAL_TABLET | Freq: Once | ORAL | Status: AC
  Administered 2016-10-14: 500 mg via ORAL
  Filled 2016-10-14: qty 1

## 2016-10-14 MED ORDER — DOXYCYCLINE HYCLATE 100 MG PO TABS
100.0000 mg | ORAL_TABLET | Freq: Once | ORAL | Status: AC
  Administered 2016-10-14: 100 mg via ORAL
  Filled 2016-10-14: qty 1

## 2016-10-14 MED ORDER — DOXYCYCLINE HYCLATE 100 MG PO TABS: 100.0000 mg | ORAL_TABLET | Freq: Two times a day (BID) | ORAL | 0 refills | Status: DC

## 2016-10-14 MED ORDER — TRAMADOL HCL 50 MG PO TABS
50.0000 mg | ORAL_TABLET | Freq: Four times a day (QID) | ORAL | 0 refills | Status: DC | PRN
Start: 2016-10-14 — End: 2016-10-16

## 2016-10-14 MED ORDER — CEFTRIAXONE SODIUM 250 MG IJ SOLR
250.0000 mg | Freq: Once | INTRAMUSCULAR | Status: AC
  Administered 2016-10-14: 250 mg via INTRAMUSCULAR
  Filled 2016-10-14: qty 250

## 2016-10-14 NOTE — ED Provider Notes (Signed)
[facility] Emergency Department Provider Note   ____________________________________________   First MD Initiated Contact with Patient 10/14/16 0010     (approximate)  I have reviewed the triage vital signs and the nursing notes.   HISTORY  Chief Complaint Abdominal Pain    HPI Theresa Fischer is a 21 y.o. female who comes into the hospital today with abdominal pain. The patient had an abortion today and was told that she may have some cramping but she reports that she has some severe lower abdominal pain. She reports that she's been taking ibuprofen 800 mg but has not been helping with her pain. The patient reached her pain a 10 out of 10 in intensity in her lower abdomen. She reports that she's been spotting some but is less than a period. The patient has had some nausea with no vomiting. She is a G3 [id number].   Past Medical History:  Diagnosis Date  . Bipolar 1 disorder (HCC)   . Depression    Bipolar  . Seasonal allergies     Patient Active Problem List   Diagnosis Date Noted  . Seasonal allergies   . Depression 12/10/2011  . Suicidal thoughts 12/10/2011  . Substance abuse (HCC) 12/10/2011    Past Surgical History:  Procedure Laterality Date  . MYRINGOTOMY    . NO PAST SURGERIES      Prior to Admission medications   Medication Sig Start Date End Date Taking? Authorizing Provider  baclofen (LIORESAL) 10 MG tablet Take 1 tablet (10 mg total) by mouth 3 (three) times daily. Patient not taking: Reported on 07/02/2016 08/20/15   Beers, Charmayne Sheer, PA-C  butalbital-acetaminophen-caffeine (FIORICET) [phone] MG tablet Take 1-2 tablets by mouth every 6 (six) hours as needed for headache. Patient not taking: Reported on 07/02/2016 08/20/15   Beers, Charmayne Sheer, PA-C  doxycycline (VIBRA-TABS) 100 MG tablet Take 1 tablet (100 mg total) by mouth 2 (two) times daily. 10/14/16   Rebecka Apley, MD  medroxyPROGESTERone (DEPO-PROVERA) 150 MG/ML injection  Inject 1 mL (150 mg total) into the muscle every 3 (three) months. 07/02/16   Tresea Mall, CNM  meloxicam (MOBIC) 15 MG tablet Take 1 tablet (15 mg total) by mouth daily. Patient not taking: Reported on 07/02/2016 08/20/15   Evangeline Dakin, PA-C  metroNIDAZOLE (FLAGYL) 500 MG tablet Take 1 tablet (500 mg total) by mouth 2 (two) times daily. 09/03/16   Irean Hong, MD  metroNIDAZOLE (FLAGYL) 500 MG tablet Take 1 tablet (500 mg total) by mouth 2 (two) times daily. 10/14/16 10/21/16  Rebecka Apley, MD  Prenatal Vit-Fe Fumarate-FA (PRENATAL MULTIVITAMIN) TABS tablet Take 1 tablet by mouth daily at 12 noon.    [provider]  traMADol (ULTRAM) 50 MG tablet Take 1 tablet (50 mg total) by mouth every 6 (six) hours as needed. 10/14/16   Rebecka Apley, MD    Allergies Patient has no known allergies.  Family History  Problem Relation Age of Onset  . CAD Mother     Social History Social History  Substance Use Topics  . Smoking status: Current Every Day Smoker    Packs/day: 0.10    Types: Cigarettes  . Smokeless tobacco: Never Used  . Alcohol use Yes     Comment: occ    Review of Systems  Constitutional: No fever/chills Eyes: No visual changes. ENT: No sore throat. Cardiovascular: Denies chest pain. Respiratory: Denies shortness of breath. Gastrointestinal:  abdominal pain, nausea, no vomiting.  No  diarrhea.  No constipation. Genitourinary: Negative for dysuria. Musculoskeletal: Negative for back pain. Skin: Negative for rash. Neurological: Negative for headaches, focal weakness or numbness.   ____________________________________________   PHYSICAL EXAM:  VITAL SIGNS: ED Triage Vitals [10/13/16 1853]  Enc Vitals Group     BP (!) 118/59     Pulse Rate 83     Resp 16     Temp 98.4 F (36.9 C)     Temp Source Oral     SpO2 100 %     Weight      Height      Head Circumference      Peak Flow      Pain Score 10     Pain Loc      Pain Edu?      Excl. in  GC?     Constitutional: Alert and oriented. Well appearing and in moderate distress. Eyes: Conjunctivae are normal. PERRL. EOMI. Head: Atraumatic. Nose: No congestion/rhinnorhea. Mouth/Throat: Mucous membranes are moist.  Oropharynx non-erythematous. Cardiovascular: Normal rate, regular rhythm. Grossly normal heart sounds.  Good peripheral circulation. Respiratory: Normal respiratory effort.  No retractions. Lungs CTAB. Gastrointestinal: Soft with some lower abdominal tenderness to palpation. No distention. positive bowel sounds Genitourinary: normal external genitalia with some tenderness to palpation over the uterus, mild blood in the vault, cervix closed with some cervical motion tenderness. Musculoskeletal: No lower extremity tenderness nor edema.  Neurologic:  Normal speech and language.  Skin:  Skin is warm, dry and intact.  Psychiatric: Mood and affect are normal.   ____________________________________________   LABS (all labs ordered are listed, but only abnormal results are displayed)  Labs Reviewed  WET PREP, GENITAL - Abnormal; Notable for the following:       Result Value   WBC, Wet Prep HPF POC MODERATE (*)    All other components within normal limits  COMPREHENSIVE METABOLIC PANEL - Abnormal; Notable for the following:    ALT 9 (*)    All other components within normal limits  CBC - Abnormal; Notable for the following:    WBC 16.0 (*)    All other components within normal limits  URINALYSIS, COMPLETE (UACMP) WITH MICROSCOPIC - Abnormal; Notable for the following:    Color, Urine YELLOW (*)    APPearance HAZY (*)    Hgb urine dipstick LARGE (*)    Bacteria, UA RARE (*)    Squamous Epithelial / LPF 0-5 (*)    All other components within normal limits  HCG, QUANTITATIVE, PREGNANCY - Abnormal; Notable for the following:    hCG, Beta Chain, Quant, S 93,005 (*)    All other components within normal limits  CHLAMYDIA/NGC RT PCR ([facility] ONLY)  LIPASE, BLOOD     ____________________________________________  EKG  none ____________________________________________  RADIOLOGY  Dg Abdomen 1 View  Result Date: 10/14/2016 CLINICAL DATA:  Abdominal pain. EXAM: ABDOMEN - 1 VIEW COMPARISON:  None. FINDINGS: Normal abdominal gas pattern. No biliary or urinary calculi. No free peritoneal air. IMPRESSION: Negative. Electronically Signed   By: Ellery Plunk M.D.   On: 10/14/2016 01:27   US Pelvis Transvanginal Non-ob (tv Only)  Result Date: 10/14/2016 CLINICAL DATA:  Acute onset of pelvic pain. Status post recent D & C. Initial encounter. EXAM: TRANSABDOMINAL AND TRANSVAGINAL ULTRASOUND OF PELVIS TECHNIQUE: Both transabdominal and transvaginal ultrasound examinations of the pelvis were performed. Transabdominal technique was performed for global imaging of the pelvis including uterus, ovaries, adnexal regions, and pelvic cul-de-sac. It was necessary to proceed with  endovaginal exam following the transabdominal exam to visualize the endometrium. COMPARISON:  Pelvic ultrasound performed 09/03/2016 FINDINGS: Uterus Measurements: 12.1 x 7.0 x 8.8 cm. No fibroids or other mass visualized. Endometrium Thickness: 4.9 cm. Heterogeneous material is noted filling the endometrial echo complex, likely reflecting clot. There is no definite evidence for retained products of conception on limited Doppler evaluation. Right ovary Measurements: 2.8 x 1.3 x 2.2 cm. Normal appearance/no adnexal mass. Left ovary Measurements: 3.6 x 2.0 x 1.8 cm. Normal appearance/no adnexal mass. Other findings No abnormal free fluid. IMPRESSION: 1. Heterogeneous material noted filling the endometrial complex, likely reflecting clot. No definite evidence for retained products of conception on limited Doppler evaluation. 2. No evidence for ovarian torsion. Electronically Signed   By: Roanna Raider M.D.   On: 10/14/2016 00:38   US Pelvis Complete  Result Date: 10/14/2016 CLINICAL DATA:  Acute  onset of pelvic pain. Status post recent D & C. Initial encounter. EXAM: TRANSABDOMINAL AND TRANSVAGINAL ULTRASOUND OF PELVIS TECHNIQUE: Both transabdominal and transvaginal ultrasound examinations of the pelvis were performed. Transabdominal technique was performed for global imaging of the pelvis including uterus, ovaries, adnexal regions, and pelvic cul-de-sac. It was necessary to proceed with endovaginal exam following the transabdominal exam to visualize the endometrium. COMPARISON:  Pelvic ultrasound performed 09/03/2016 FINDINGS: Uterus Measurements: 12.1 x 7.0 x 8.8 cm. No fibroids or other mass visualized. Endometrium Thickness: 4.9 cm. Heterogeneous material is noted filling the endometrial echo complex, likely reflecting clot. There is no definite evidence for retained products of conception on limited Doppler evaluation. Right ovary Measurements: 2.8 x 1.3 x 2.2 cm. Normal appearance/no adnexal mass. Left ovary Measurements: 3.6 x 2.0 x 1.8 cm. Normal appearance/no adnexal mass. Other findings No abnormal free fluid. IMPRESSION: 1. Heterogeneous material noted filling the endometrial complex, likely reflecting clot. No definite evidence for retained products of conception on limited Doppler evaluation. 2. No evidence for ovarian torsion. Electronically Signed   By: Roanna Raider M.D.   On: 10/14/2016 00:38    ____________________________________________   PROCEDURES  Procedure(s) performed: None  Procedures  Critical Care performed: No  ____________________________________________   INITIAL IMPRESSION / ASSESSMENT AND PLAN / ED COURSE  As part of my medical decision making, I reviewed the following data within the electronic MEDICAL RECORD NUMBER Notes from prior ED visits and [place] Controlled Substance Database   This is a 21 year old female who comes into the hospital today with some abdominal pain after having an abortion.  My differential diagnosis includes endometritis,  perforation, post abortion pain.  The patient does have an elevated white blood cell count, the patient did have an ultrasound which showed some material filling the endometrial complex which is likely a clot but no definite evidence for retained products. Although the patient just had a procedure done today I am concerned about endometritis. The patient did not comment that she had been given any prophylactic antibiotics. I did give the patient a shot of ceftriaxone as well as some doxycycline and metronidazole. She received morphine and Zofran for pain and nausea as well. I will empirically treat the patient for endometritis and I will have the patient follow-up with her OB/GYN for further evaluation. The patient did receive 2 Percocet prior to her discharge to help control her pain at home.      ____________________________________________   FINAL CLINICAL IMPRESSION(S) / ED DIAGNOSES  Final diagnoses:  Lower abdominal pain  Endometritis      NEW MEDICATIONS STARTED DURING THIS VISIT:  Discharge  Medication List as of 10/14/2016  2:36 AM    START taking these medications   Details  doxycycline (VIBRA-TABS) 100 MG tablet Take 1 tablet (100 mg total) by mouth 2 (two) times daily., Starting Sun 10/14/2016, Print    !! metroNIDAZOLE (FLAGYL) 500 MG tablet Take 1 tablet (500 mg total) by mouth 2 (two) times daily., Starting Sun 10/14/2016, Until Sun 10/21/2016, Print    traMADol (ULTRAM) 50 MG tablet Take 1 tablet (50 mg total) by mouth every 6 (six) hours as needed., Starting Sun 10/14/2016, Print     !! - Potential duplicate medications found. Please discuss with provider.       Note:  This document was prepared using Dragon voice recognition software and may include unintentional dictation errors.    Rebecka Apley, MD 10/14/16 [phone]

## 2016-10-14 NOTE — Discharge Instructions (Signed)
Please follow up with [facility] OB[facility]. Please return with any worsened condition or any worsening pain

## 2016-10-14 NOTE — ED Notes (Signed)
Patient transported to Ultrasound 

## 2016-10-16 ENCOUNTER — Emergency Department
Admission: EM | Admit: 2016-10-16 | Discharge: 2016-10-16 | Disposition: A | Discharge status: Home / Self Care | Payer: Medicaid Other | Attending: Student in an Organized Health Care Education/Training Program | Admitting: Student in an Organized Health Care Education/Training Program

## 2016-10-16 ENCOUNTER — Encounter: Payer: Self-pay | Admitting: Emergency Medicine

## 2016-10-16 ENCOUNTER — Emergency Department: Payer: Medicaid Other

## 2016-10-16 ENCOUNTER — Encounter
Admission: EM | Disposition: A | Discharge status: Home / Self Care | Payer: Self-pay | Source: Home / Self Care | Attending: Student in an Organized Health Care Education/Training Program

## 2016-10-16 ENCOUNTER — Emergency Department: Payer: Medicaid Other | Admitting: Anesthesiology

## 2016-10-16 DIAGNOSIS — F17210 Nicotine dependence, cigarettes, uncomplicated: Secondary | ICD-10-CM | POA: Diagnosis not present

## 2016-10-16 DIAGNOSIS — R1031 Right lower quadrant pain: Secondary | ICD-10-CM

## 2016-10-16 DIAGNOSIS — Z793 Long term (current) use of hormonal contraceptives: Secondary | ICD-10-CM | POA: Diagnosis not present

## 2016-10-16 DIAGNOSIS — O034 Incomplete spontaneous abortion without complication: Secondary | ICD-10-CM

## 2016-10-16 HISTORY — PX: DILATION AND EVACUATION: SHX1459

## 2016-10-16 LAB — CBC
HEMATOCRIT: 36.5 % (ref 35.0–47.0)
Hemoglobin: 12.5 g/dL (ref 12.0–16.0)
MCH: 33.3 pg (ref 26.0–34.0)
MCHC: 34.3 g/dL (ref 32.0–36.0)
MCV: 97.2 fL (ref 80.0–100.0)
Platelets: 222 10*3/uL (ref 150–440)
RBC: 3.76 MIL/uL — ABNORMAL LOW (ref 3.80–5.20)
RDW: 12.2 % (ref 11.5–14.5)
WBC: 8.6 10*3/uL (ref 3.6–11.0)

## 2016-10-16 LAB — URINALYSIS, COMPLETE (UACMP) WITH MICROSCOPIC
BACTERIA UA: NONE SEEN
BILIRUBIN URINE: NEGATIVE
Glucose, UA: NEGATIVE mg/dL
Ketones, ur: NEGATIVE mg/dL
Nitrite: NEGATIVE
PROTEIN: NEGATIVE mg/dL
SPECIFIC GRAVITY, URINE: 1.017 (ref 1.005–1.030)
pH: 6 (ref 5.0–8.0)

## 2016-10-16 LAB — HCG, QUANTITATIVE, PREGNANCY: HCG, BETA CHAIN, QUANT, S: 12782 m[IU]/mL — AB (ref <5)

## 2016-10-16 LAB — COMPREHENSIVE METABOLIC PANEL
ALBUMIN: 4 g/dL (ref 3.5–5.0)
ALT: 9 U/L — ABNORMAL LOW (ref 14–54)
AST: 17 U/L (ref 15–41)
Alkaline Phosphatase: 65 U/L (ref 38–126)
Anion gap: 7 (ref 5–15)
BUN: 9 mg/dL (ref 6–20)
CHLORIDE: 105 mmol/L (ref 101–111)
CO2: 26 mmol/L (ref 22–32)
Calcium: 9.4 mg/dL (ref 8.9–10.3)
Creatinine, Ser: 0.64 mg/dL (ref 0.44–1.00)
GFR calc Af Amer: >60 mL/min (ref >60)
Glucose, Bld: 116 mg/dL — ABNORMAL HIGH (ref 65–99)
POTASSIUM: 4.4 mmol/L (ref 3.5–5.1)
SODIUM: 138 mmol/L (ref 135–145)
Total Bilirubin: 0.9 mg/dL (ref 0.3–1.2)
Total Protein: 6.9 g/dL (ref 6.5–8.1)

## 2016-10-16 LAB — LIPASE, BLOOD: LIPASE: 18 U/L (ref 11–51)

## 2016-10-16 SURGERY — DILATION AND EVACUATION, UTERUS
Anesthesia: General | Site: Vagina | Wound class: Clean Contaminated

## 2016-10-16 MED ORDER — ONDANSETRON HCL 4 MG/2ML IJ SOLN
INTRAMUSCULAR | Status: AC
  Filled 2016-10-16: qty 2

## 2016-10-16 MED ORDER — SODIUM CHLORIDE 0.9 % IV BOLUS (SEPSIS)
1000.0000 mL | Freq: Once | INTRAVENOUS | Status: AC
  Administered 2016-10-16: 1000 mL via INTRAVENOUS

## 2016-10-16 MED ORDER — MEPERIDINE HCL 50 MG/ML IJ SOLN: 6.2500 mg | INTRAMUSCULAR | Status: DC | PRN

## 2016-10-16 MED ORDER — LACTATED RINGERS IV SOLN: INTRAVENOUS | Status: DC

## 2016-10-16 MED ORDER — PROMETHAZINE HCL 25 MG/ML IJ SOLN: 6.2500 mg | INTRAMUSCULAR | Status: DC | PRN

## 2016-10-16 MED ORDER — PROPOFOL 10 MG/ML IV BOLUS
INTRAVENOUS | Status: DC | PRN
  Administered 2016-10-16: 130 mg via INTRAVENOUS

## 2016-10-16 MED ORDER — MIDAZOLAM HCL 2 MG/2ML IJ SOLN
INTRAMUSCULAR | Status: AC
  Filled 2016-10-16: qty 2

## 2016-10-16 MED ORDER — DEXAMETHASONE SODIUM PHOSPHATE 10 MG/ML IJ SOLN
INTRAMUSCULAR | Status: DC | PRN
  Administered 2016-10-16: 10 mg via INTRAVENOUS

## 2016-10-16 MED ORDER — FENTANYL CITRATE (PF) 100 MCG/2ML IJ SOLN: 25.0000 ug | INTRAMUSCULAR | Status: DC | PRN

## 2016-10-16 MED ORDER — KETOROLAC TROMETHAMINE 30 MG/ML IJ SOLN
15.0000 mg | Freq: Once | INTRAMUSCULAR | Status: AC
  Administered 2016-10-16: 15 mg via INTRAVENOUS
  Filled 2016-10-16: qty 1

## 2016-10-16 MED ORDER — METHYLERGONOVINE MALEATE 0.2 MG PO TABS: 0.2000 mg | ORAL_TABLET | Freq: Three times a day (TID) | ORAL | 0 refills | Status: AC

## 2016-10-16 MED ORDER — SILVER NITRATE-POT NITRATE 75-25 % EX MISC
CUTANEOUS | Status: DC | PRN
  Administered 2016-10-16: 2

## 2016-10-16 MED ORDER — PROPOFOL 10 MG/ML IV BOLUS
INTRAVENOUS | Status: AC
  Filled 2016-10-16: qty 20

## 2016-10-16 MED ORDER — DOXYCYCLINE HYCLATE 100 MG IV SOLR
200.0000 mg | INTRAVENOUS | Status: AC
  Administered 2016-10-16: 200 mg via INTRAVENOUS
  Filled 2016-10-16: qty 200

## 2016-10-16 MED ORDER — SUCCINYLCHOLINE CHLORIDE 20 MG/ML IJ SOLN
INTRAMUSCULAR | Status: DC | PRN
  Administered 2016-10-16: 60 mg via INTRAVENOUS

## 2016-10-16 MED ORDER — SILVER NITRATE-POT NITRATE 75-25 % EX MISC
CUTANEOUS | Status: AC
  Filled 2016-10-16: qty 4

## 2016-10-16 MED ORDER — LIDOCAINE HCL (CARDIAC) 20 MG/ML IV SOLN
INTRAVENOUS | Status: DC | PRN
  Administered 2016-10-16: 60 mg via INTRAVENOUS

## 2016-10-16 MED ORDER — LACTATED RINGERS IV SOLN
INTRAVENOUS | Status: DC | PRN
  Administered 2016-10-16: 19:00:00 via INTRAVENOUS

## 2016-10-16 MED ORDER — IBUPROFEN 600 MG PO TABS: 600.0000 mg | ORAL_TABLET | Freq: Four times a day (QID) | ORAL | 3 refills | Status: DC | PRN

## 2016-10-16 MED ORDER — SILVER NITRATE-POT NITRATE 75-25 % EX MISC
CUTANEOUS | Status: AC
  Filled 2016-10-16: qty 1

## 2016-10-16 MED ORDER — FENTANYL CITRATE (PF) 100 MCG/2ML IJ SOLN
INTRAMUSCULAR | Status: AC
  Filled 2016-10-16: qty 2

## 2016-10-16 MED ORDER — ONDANSETRON HCL 4 MG/2ML IJ SOLN
INTRAMUSCULAR | Status: DC | PRN
  Administered 2016-10-16: 4 mg via INTRAVENOUS

## 2016-10-16 MED ORDER — OXYCODONE-ACETAMINOPHEN 5-325 MG PO TABS
1.0000 | ORAL_TABLET | Freq: Once | ORAL | Status: AC
  Administered 2016-10-16: 1 via ORAL
  Filled 2016-10-16: qty 1

## 2016-10-16 MED ORDER — MIDAZOLAM HCL 2 MG/2ML IJ SOLN
INTRAMUSCULAR | Status: DC | PRN
  Administered 2016-10-16: 2 mg via INTRAVENOUS

## 2016-10-16 MED ORDER — HYDROCODONE-ACETAMINOPHEN 5-325 MG PO TABS: 1.0000 | ORAL_TABLET | Freq: Four times a day (QID) | ORAL | 0 refills | Status: DC | PRN

## 2016-10-16 MED ORDER — DEXAMETHASONE SODIUM PHOSPHATE 10 MG/ML IJ SOLN
INTRAMUSCULAR | Status: AC
  Filled 2016-10-16: qty 1

## 2016-10-16 MED ORDER — FENTANYL CITRATE (PF) 100 MCG/2ML IJ SOLN
INTRAMUSCULAR | Status: DC | PRN
  Administered 2016-10-16 (×2): 50 ug via INTRAVENOUS

## 2016-10-16 MED ORDER — SUCCINYLCHOLINE CHLORIDE 20 MG/ML IJ SOLN
INTRAMUSCULAR | Status: AC
  Filled 2016-10-16: qty 1

## 2016-10-16 MED ORDER — OXYCODONE HCL 5 MG/5ML PO SOLN: 5.0000 mg | Freq: Once | ORAL | Status: DC | PRN

## 2016-10-16 MED ORDER — OXYCODONE HCL 5 MG PO TABS: 5.0000 mg | ORAL_TABLET | Freq: Once | ORAL | Status: DC | PRN

## 2016-10-16 SURGICAL SUPPLY — 20 items
BAG URO DRAIN 2000ML W/SPOUT (MISCELLANEOUS) IMPLANT
CATH FOLEY 2WAY  5CC 16FR (CATHETERS)
CATH ROBINSON RED A/P 16FR (CATHETERS) ×2 IMPLANT
CATH URTH 16FR FL 2W BLN LF (CATHETERS) IMPLANT
FILTER UTR ASPR SPEC (MISCELLANEOUS) ×1 IMPLANT
FLTR UTR ASPR SPEC (MISCELLANEOUS) ×2
GLOVE BIO SURGEON STRL SZ7 (GLOVE) ×4 IMPLANT
GOWN STRL REUS W/ TWL LRG LVL3 (GOWN DISPOSABLE) ×2 IMPLANT
GOWN STRL REUS W/TWL LRG LVL3 (GOWN DISPOSABLE) ×2
KIT BERKELEY 1ST TRIMESTER 3/8 (MISCELLANEOUS) ×2 IMPLANT
KIT RM TURNOVER CYSTO AR (KITS) ×2 IMPLANT
NS IRRIG 500ML POUR BTL (IV SOLUTION) ×2 IMPLANT
PACK DNC HYST (MISCELLANEOUS) ×2 IMPLANT
PAD OB MATERNITY 4.3X12.25 (PERSONAL CARE ITEMS) ×2 IMPLANT
PAD PREP 24X41 OB/GYN DISP (PERSONAL CARE ITEMS) ×2 IMPLANT
SET BERKELEY SUCTION TUBING (SUCTIONS) ×4 IMPLANT
TOWEL OR 17X26 4PK STRL BLUE (TOWEL DISPOSABLE) ×2 IMPLANT
VACURETTE 10 RIGID CVD (CANNULA) IMPLANT
VACURETTE 12 RIGID CVD (CANNULA) IMPLANT
VACURETTE 8 RIGID CVD (CANNULA) ×2 IMPLANT

## 2016-10-16 NOTE — Anesthesia Postprocedure Evaluation (Signed)
Anesthesia Post Note  Patient: Theresa Fischer  Procedure(s) Performed: DILATATION AND EVACUATION (N/A Vagina )  Patient location during evaluation: PACU Anesthesia Type: General Level of consciousness: awake and alert and oriented Pain management: pain level controlled Vital Signs Assessment: post-procedure vital signs reviewed and stable Respiratory status: spontaneous breathing, nonlabored ventilation and respiratory function stable Cardiovascular status: blood pressure returned to baseline and stable Postop Assessment: no signs of nausea or vomiting Anesthetic complications: no     Last Vitals:  Vitals:   10/16/16 2138 10/16/16 2235  BP: 111/63 (!) 100/57  Pulse: 77 80  Resp: 16 18  Temp:    SpO2: 99% 99%    Last Pain:  Vitals:   10/16/16 2138  TempSrc:   PainSc: 0-No pain                 Brennyn Ortlieb

## 2016-10-16 NOTE — Discharge Instructions (Signed)

## 2016-10-16 NOTE — Op Note (Signed)
Operative Note   Theresa Fischer  10/16/2016  PRE-OP DIAGNOSIS: retained products of conception following abortion   POST-OP DIAGNOSIS: retained products of conception following abortion   SURGEON: Surgeon(s) and Role:    Conard Novak, MD - Primary  PROCEDURE: Procedure(s): SUCTION, DILATATION AND EVACUATION   ANESTHESIA: General   ESTIMATED BLOOD LOSS: 300 mL  DRAINS: none   TOTAL IV FLUIDS: 500 mL crystalloid  SPECIMENS:  Endometrial curettings  VTE PROPHYLAXIS: SCDs to the bilateral lower extremities  ANTIBIOTICS: doxycycline 200 mg ordered for pre-op, but not given until postop due to pharmacy delay  COMPLICATIONS: none  DISPOSITION: PACU - hemodynamically stable.  CONDITION: stable  INDICATION: 21 y.o. [id number] female status post elective abortion on 10/13/16, who presented to the ER for the second time since her procedure with severe abdominal pain and on ultrasound retained products of conception.   FINDINGS: Exam under anesthesia revealed small, mobile anteverted uterus with no masses and bilateral adnexa without masses or fullness.   PROCEDURE IN DETAIL:  After informed consent was confirmed, the patient was taken to the operating room where general anesthesia was induced.  Her legs were carefully placed in the candy cane stirrups.  She was prepped and draped in the standard fashion.  A speculum was placed in the vagina and the cervix was prepped with betadine.   A tenaculum was placed on the anterior lip of the cervix.  The cervix was serially dilated to 8 mm.  The 8 mm suction curette was advanced to the uterine fundus.  Three passes were made with the suction curette with evacuation of adequate tissue noted.  Two passes were made using sharp curettage until a gritty texture was noted.  One final pass was made with the suction curette.  All instruments were removed from the vagina and the cervix was noted to be hemostatic after removal of the tenaculum with  the application of silver nitrate.  At the end of the procedure, sponge, lap, and needle counts were correct x 2.    Conard Novak, MD 10/16/2016 8:03 PM

## 2016-10-16 NOTE — Anesthesia Procedure Notes (Signed)
Procedure Name: Intubation Date/Time: 10/16/2016 7:27 PM Performed by: Waldo Laine Pre-anesthesia Checklist: Patient identified, Patient being monitored, Timeout performed, Emergency Drugs available and Suction available Patient Re-evaluated:Patient Re-evaluated prior to induction Oxygen Delivery Method: Circle system utilized Preoxygenation: Pre-oxygenation with 100% oxygen Induction Type: IV induction Laryngoscope Size: Miller and 2 Grade View: Grade I Tube type: Oral Tube size: 7.0 mm Number of attempts: 1 Airway Equipment and Method: Stylet Placement Confirmation: ETT inserted through vocal cords under direct vision,  positive ETCO2 and breath sounds checked- equal and bilateral Secured at: 21 cm Tube secured with: Tape Dental Injury: Teeth and Oropharynx as per pre-operative assessment

## 2016-10-16 NOTE — ED Triage Notes (Signed)
Abortion on Saturday, was seen here on Saturday with vag bledding and abd pain, today worsening pain and symptoms

## 2016-10-16 NOTE — ED Provider Notes (Signed)
[facility] Emergency Department Provider Note    None    (approximate)  I have reviewed the triage vital signs and the nursing notes.   HISTORY  Chief Complaint Abdominal Pain    HPI Theresa Fischer is a 21 y.o. female presents with moderate to severe suprapubic and right lower quadrant cramping abdominal pain without radiation. Patient had an elective D&C performed on Saturday for elective abortion. States that she was told that she should expect some cramping but this pain is severe and she is having difficulty walking due to the pain. Has not had any intercourse since then. No change in discharge. She's been compliant with the antibiotics that she was given. Denies any fevers. Has had nausea but no vomiting. No chest pain or shortness of breath. No dysuria. No flank pain.   Past Medical History:  Diagnosis Date  . Bipolar 1 disorder (HCC)   . Depression    Bipolar  . Seasonal allergies    Family History  Problem Relation Age of Onset  . CAD Mother    Past Surgical History:  Procedure Laterality Date  . MYRINGOTOMY    . NO PAST SURGERIES     Patient Active Problem List   Diagnosis Date Noted  . Seasonal allergies   . Depression 12/10/2011  . Suicidal thoughts 12/10/2011  . Substance abuse (HCC) 12/10/2011      Prior to Admission medications   Medication Sig Start Date End Date Taking? Authorizing Provider  doxycycline (VIBRA-TABS) 100 MG tablet Take 1 tablet (100 mg total) by mouth 2 (two) times daily. 10/14/16  Yes Rebecka Apley, MD  medroxyPROGESTERone (DEPO-PROVERA) 150 MG/ML injection Inject 1 mL (150 mg total) into the muscle every 3 (three) months. 07/02/16  Yes Tresea Mall, CNM  metroNIDAZOLE (FLAGYL) 500 MG tablet Take 1 tablet (500 mg total) by mouth 2 (two) times daily. 10/14/16 10/21/16 Yes Rebecka Apley, MD  traMADol (ULTRAM) 50 MG tablet Take 1 tablet (50 mg total) by mouth every 6 (six) hours as needed. Patient  taking differently: Take 50 mg by mouth every 6 (six) hours as needed for severe pain.  10/14/16  Yes Rebecka Apley, MD    Allergies Patient has no known allergies.    Social History Social History  Substance Use Topics  . Smoking status: Current Every Day Smoker    Packs/day: 0.10    Types: Cigarettes  . Smokeless tobacco: Never Used  . Alcohol use Yes     Comment: occ    Review of Systems Patient denies headaches, rhinorrhea, blurry vision, numbness, shortness of breath, chest pain, edema, cough, abdominal pain, nausea, vomiting, diarrhea, dysuria, fevers, rashes or hallucinations unless otherwise stated above in HPI. ____________________________________________   PHYSICAL EXAM:  VITAL SIGNS: Vitals:   10/16/16 1234 10/16/16 1849  BP: 105/80 110/68  Pulse: (!) 103 80  Resp: 18 16  Temp: 98.4 F (36.9 C)   SpO2: 99% 100%    Constitutional: Alert and oriented. in no acute distress. Eyes: Conjunctivae are normal.  Head: Atraumatic. Nose: No congestion/rhinnorhea. Mouth/Throat: Mucous membranes are moist.   Neck: No stridor. Painless ROM.  Cardiovascular: Normal rate, regular rhythm. Grossly normal heart sounds.  Good peripheral circulation. Respiratory: Normal respiratory effort.  No retractions. Lungs CTAB. Gastrointestinal: Soft and nontender. No distention. No abdominal bruits. No CVA tenderness. Genitourinary: deferred Musculoskeletal: No lower extremity tenderness nor edema.  No joint effusions. Neurologic:  Normal speech and language. No gross focal neurologic deficits  are appreciated. No facial droop Skin:  Skin is warm, dry and intact. No rash noted. Psychiatric: Mood and affect are normal. Speech and behavior are normal.  ____________________________________________   LABS (all labs ordered are listed, but only abnormal results are displayed)  Results for orders placed or performed during the hospital encounter of 10/16/16 (from the past 24  hour(s))  Lipase, blood     Status: None   Collection Time: 10/16/16 12:29 PM  Result Value Ref Range   Lipase 18 11 - 51 U/L  Comprehensive metabolic panel     Status: Abnormal   Collection Time: 10/16/16 12:29 PM  Result Value Ref Range   Sodium 138 135 - 145 mmol/L   Potassium 4.4 3.5 - 5.1 mmol/L   Chloride 105 101 - 111 mmol/L   CO2 26 22 - 32 mmol/L   Glucose, Bld 116 (H) 65 - 99 mg/dL   BUN 9 6 - 20 mg/dL   Creatinine, Ser 1.61 0.44 - 1.00 mg/dL   Calcium 9.4 8.9 - 09.6 mg/dL   Total Protein 6.9 6.5 - 8.1 g/dL   Albumin 4.0 3.5 - 5.0 g/dL   AST 17 15 - 41 U/L   ALT 9 (L) 14 - 54 U/L   Alkaline Phosphatase 65 38 - 126 U/L   Total Bilirubin 0.9 0.3 - 1.2 mg/dL   GFR calc non Af Amer >60 >60 mL/min   GFR calc Af Amer >60 >60 mL/min   Anion gap 7 5 - 15  CBC     Status: Abnormal   Collection Time: 10/16/16 12:29 PM  Result Value Ref Range   WBC 8.6 3.6 - 11.0 K/uL   RBC 3.76 (L) 3.80 - 5.20 MIL/uL   Hemoglobin 12.5 12.0 - 16.0 g/dL   HCT 04.5 40.9 - 81.1 %   MCV 97.2 80.0 - 100.0 fL   MCH 33.3 26.0 - 34.0 pg   MCHC 34.3 32.0 - 36.0 g/dL   RDW 91.4 78.2 - 95.6 %   Platelets 222 150 - 440 K/uL  Urinalysis, Complete w Microscopic     Status: Abnormal   Collection Time: 10/16/16 12:29 PM  Result Value Ref Range   Color, Urine YELLOW (A) YELLOW   APPearance HAZY (A) CLEAR   Specific Gravity, Urine 1.017 1.005 - 1.030   pH 6.0 5.0 - 8.0   Glucose, UA NEGATIVE NEGATIVE mg/dL   Hgb urine dipstick LARGE (A) NEGATIVE   Bilirubin Urine NEGATIVE NEGATIVE   Ketones, ur NEGATIVE NEGATIVE mg/dL   Protein, ur NEGATIVE NEGATIVE mg/dL   Nitrite NEGATIVE NEGATIVE   Leukocytes, UA TRACE (A) NEGATIVE   RBC / HPF TOO NUMEROUS TO COUNT 0 - 5 RBC/hpf   WBC, UA 6-30 0 - 5 WBC/hpf   Bacteria, UA NONE SEEN NONE SEEN   Squamous Epithelial / LPF 6-30 (A) NONE SEEN   Mucus PRESENT    Hyaline Casts, UA PRESENT   hCG, quantitative, pregnancy     Status: Abnormal   Collection Time:  10/16/16  2:20 PM  Result Value Ref Range   hCG, Beta Chain, Quant, S 12,782 (H) <5 mIU/mL  Type and screen [facility]     Status: None (Preliminary result)   Collection Time: 10/16/16  6:33 PM  Result Value Ref Range   ABO/RH(D) PENDING    Antibody Screen PENDING    Sample Expiration 10/19/2016    ____________________________________________ _______________________________  RADIOLOGY  I personally reviewed all radiographic images ordered to evaluate  for the above acute complaints and reviewed radiology reports and findings.  These findings were personally discussed with the patient.  Please see medical record for radiology report.  ____________________________________________   PROCEDURES  Procedure(s) performed:  Procedures    Critical Care performed: no ____________________________________________   INITIAL IMPRESSION / ASSESSMENT AND PLAN / ED COURSE  Pertinent labs & imaging results that were available during my care of the patient were reviewed by me and considered in my medical decision making (see chart for details).  DDX: retained poc, torsion, cyst, endometritis, ectopic, appy  Theresa Fischer is a 21 y.o. who presents to the ED with effusions. Right lower quadrant pain several days after elective termination.  Patient is AFVSS in ED. Exam as above. Given current presentation have considered the above differential. Blood work and ultrasound be sent for the above differential.  Clinical Course as of Oct 17 1931  Tue Oct 16, 2016  1537 Patient ambulating comfortably with no distress.  [PR]  1623 reviewed results of ultrasound. I have consulted Dr. Jean Rosenthal of OB/GYN regarding concern for retained products of conception.  [PR]  1714 patient states pain has improved. Updated on plan.  [PR]    Clinical Course User Index [PR] Willy Eddy, MD    Dr. Jean Rosenthal has evaluated patient and bedside and they have agreed to proceed with D&C.   Have discussed with the patient and available family all diagnostics and treatments performed thus far and all questions were answered to the best of my ability. The patient demonstrates understanding and agreement with plan.  ____________________________________________   FINAL CLINICAL IMPRESSION(S) / ED DIAGNOSES  Final diagnoses:  Abdominal pain, RLQ  Retained products of conception following abortion      NEW MEDICATIONS STARTED DURING THIS VISIT:  New Prescriptions   No medications on file     Note:  This document was prepared using Dragon voice recognition software and may include unintentional dictation errors.    Willy Eddy, MD 10/16/16 [phone]

## 2016-10-16 NOTE — Anesthesia Post-op Follow-up Note (Signed)
Anesthesia QCDR form completed.        

## 2016-10-16 NOTE — Anesthesia Preprocedure Evaluation (Signed)
Anesthesia Evaluation  Patient identified by MRN, date of birth, ID band Patient awake    Reviewed: Allergy & Precautions, NPO status , Patient's Chart, lab work & pertinent test results  History of Anesthesia Complications Negative for: history of anesthetic complications  Airway Mallampati: II  TM Distance: >3 FB Neck ROM: Full    Dental no notable dental hx.    Pulmonary neg sleep apnea, neg COPD, Current Smoker,    breath sounds clear to auscultation- rhonchi (-) wheezing      Cardiovascular Exercise Tolerance: Good (-) hypertension(-) CAD and (-) Past MI  Rhythm:Regular Rate:Normal - Systolic murmurs and - Diastolic murmurs    Neuro/Psych PSYCHIATRIC DISORDERS Depression Bipolar Disorder negative neurological ROS     GI/Hepatic negative GI ROS, Neg liver ROS,   Endo/Other  negative endocrine ROSneg diabetes  Renal/GU negative Renal ROS     Musculoskeletal negative musculoskeletal ROS (+)   Abdominal (+) - obese,   Peds  Hematology negative hematology ROS (+)   Anesthesia Other Findings   Reproductive/Obstetrics                             Anesthesia Physical Anesthesia Plan  ASA: II and emergent  Anesthesia Plan: General   Post-op Pain Management:    Induction: Intravenous and Rapid sequence  PONV Risk Score and Plan: 1 and Ondansetron and Dexamethasone  Airway Management Planned: Oral ETT  Additional Equipment:   Intra-op Plan:   Post-operative Plan: Extubation in OR  Informed Consent: I have reviewed the patients History and Physical, chart, labs and discussed the procedure including the risks, benefits and alternatives for the proposed anesthesia with the patient or authorized representative who has indicated his/her understanding and acceptance.   Dental advisory given  Plan Discussed with: CRNA and Anesthesiologist  Anesthesia Plan Comments:          Anesthesia Quick Evaluation

## 2016-10-16 NOTE — H&P (Signed)
Preoperative History and Physical  Theresa Fischer is a 21 y.o. [phone] here for surgical management of retained spontaneous products of conception.   No significant preoperative concerns.  History of Present Illness: 21 y.o. [id number] female who underwent an elective abortive procedure in [place] (in-clinic aspiration). She presented to the ER Saturday for abdominal pain and mild bleeding postoperatively. The ultrasound showed 4.9 cm of products in the uterus. At the time, no products of conception were appreciated on ultrasound. She was discharged with pain medication and presumed endometritis (given antibiotics of flagyl and doxycycline).  She presented earlier today with what she describes as incapacitating pain with period-like bleeding, passing small clots. She denies fevers, chills, nausea, vomiting.  Her quantitative hCG on Saturday was about 95,000. Today it has dropped to about 12,000.  On ultrasound the collection in her uterus is now 5.5 cm with vascularity noted and now suspicion for retained products of conception.  Proposed surgery: Suction, dilation and curettage.  Past Medical History:  Diagnosis Date  . Bipolar 1 disorder (HCC)   . Depression    Bipolar  . Seasonal allergies    Past Surgical History:  Procedure Laterality Date  . MYRINGOTOMY     OB History  Gravida Para Term Preterm AB Living  3 2 2  0 1 2  SAB TAB Ectopic Multiple Live Births  0 1 0 0 2    # Outcome Date GA Lbr Len/2nd Weight Sex Delivery Anes PTL Lv  3 TAB 10/13/16          2 Term 02/14/15 [redacted]w[redacted]d 05:39 / 00:09 7 lb 8.3 oz (3.41 kg) M Vag-Spont EPI  LIV  1 Term 11/27/13 [redacted]w[redacted]d  7 lb 7 oz (3.374 kg) F Vag-Spont   LIV    Patient denies any other pertinent gynecologic issues.   No current facility-administered medications on file prior to encounter.    Current Outpatient Prescriptions on File Prior to Encounter  Medication Sig Dispense Refill  . doxycycline (VIBRA-TABS) 100 MG tablet Take 1  tablet (100 mg total) by mouth 2 (two) times daily. 14 tablet 0  . medroxyPROGESTERone (DEPO-PROVERA) 150 MG/ML injection Inject 1 mL (150 mg total) into the muscle every 3 (three) months. 1 mL 3  . metroNIDAZOLE (FLAGYL) 500 MG tablet Take 1 tablet (500 mg total) by mouth 2 (two) times daily. 14 tablet 0  . traMADol (ULTRAM) 50 MG tablet Take 1 tablet (50 mg total) by mouth every 6 (six) hours as needed. (Patient taking differently: Take 50 mg by mouth every 6 (six) hours as needed for severe pain. ) 12 tablet 0   Allergies: No Known Allergies  Social History:   reports that she has been smoking Cigarettes.  She has been smoking about 0.10 packs per day. She has never used smokeless tobacco. She reports that she drinks alcohol. She reports that she does not use drugs.  Family History  Problem Relation Age of Onset  . CAD Mother     Review of Systems:  Review of Systems  Constitutional: Negative.   HENT: Negative.   Eyes: Negative.   Respiratory: Negative.   Cardiovascular: Negative.   Gastrointestinal: Positive for abdominal pain. Negative for blood in stool, constipation, diarrhea, heartburn, melena, nausea and vomiting.  Genitourinary: Negative.        See HPI  Musculoskeletal: Negative.   Skin: Negative.   Neurological: Negative.   Psychiatric/Behavioral: Negative.      PHYSICAL EXAM: Blood pressure 110/68, pulse 80,  temperature 98.4 F (36.9 C), temperature source Oral, resp. rate 16, height  (1.575 m), weight 130 lb (59 kg), last menstrual period 08/01/2016, SpO2 100 %, not currently breastfeeding. Physical Exam  Constitutional: She is oriented to person, place, and time. She appears well-developed and well-nourished. No distress.  HENT:  Head: Normocephalic and atraumatic.  Eyes: Conjunctivae are normal. No scleral icterus.  Neck: Normal range of motion. Neck supple. No thyromegaly present.  Cardiovascular: Normal rate and regular rhythm.  Exam reveals no gallop and  no friction rub.   No murmur heard. Pulmonary/Chest: Effort normal and breath sounds normal. No respiratory distress. She has no wheezes. She has no rales.  Abdominal: Soft. She exhibits no distension and no mass. There is tenderness (bilateral lower quadrants). There is no rebound and no guarding.  Musculoskeletal: Normal range of motion. She exhibits no edema or tenderness.  Lymphadenopathy:    She has no cervical adenopathy.  Neurological: She is alert and oriented to person, place, and time. No cranial nerve deficit.  Skin: Skin is warm and dry. No rash noted. No erythema.  Psychiatric: She has a normal mood and affect. Her behavior is normal. Judgment normal.    Labs: Results for orders placed or performed during the hospital encounter of 10/16/16 (from the past 336 hour(s))  Lipase, blood   Collection Time: 10/16/16 12:29 PM  Result Value Ref Range   Lipase 18 11 - 51 U/L  Comprehensive metabolic panel   Collection Time: 10/16/16 12:29 PM  Result Value Ref Range   Sodium 138 135 - 145 mmol/L   Potassium 4.4 3.5 - 5.1 mmol/L   Chloride 105 101 - 111 mmol/L   CO2 26 22 - 32 mmol/L   Glucose, Bld 116 (H) 65 - 99 mg/dL   BUN 9 6 - 20 mg/dL   Creatinine, Ser 1.61 0.44 - 1.00 mg/dL   Calcium 9.4 8.9 - 09.6 mg/dL   Total Protein 6.9 6.5 - 8.1 g/dL   Albumin 4.0 3.5 - 5.0 g/dL   AST 17 15 - 41 U/L   ALT 9 (L) 14 - 54 U/L   Alkaline Phosphatase 65 38 - 126 U/L   Total Bilirubin 0.9 0.3 - 1.2 mg/dL   GFR calc non Af Amer >60 >60 mL/min   GFR calc Af Amer >60 >60 mL/min   Anion gap 7 5 - 15  CBC   Collection Time: 10/16/16 12:29 PM  Result Value Ref Range   WBC 8.6 3.6 - 11.0 K/uL   RBC 3.76 (L) 3.80 - 5.20 MIL/uL   Hemoglobin 12.5 12.0 - 16.0 g/dL   HCT 04.5 40.9 - 81.1 %   MCV 97.2 80.0 - 100.0 fL   MCH 33.3 26.0 - 34.0 pg   MCHC 34.3 32.0 - 36.0 g/dL   RDW 91.4 78.2 - 95.6 %   Platelets 222 150 - 440 K/uL  Urinalysis, Complete w Microscopic   Collection Time: 10/16/16  12:29 PM  Result Value Ref Range   Color, Urine YELLOW (A) YELLOW   APPearance HAZY (A) CLEAR   Specific Gravity, Urine 1.017 1.005 - 1.030   pH 6.0 5.0 - 8.0   Glucose, UA NEGATIVE NEGATIVE mg/dL   Hgb urine dipstick LARGE (A) NEGATIVE   Bilirubin Urine NEGATIVE NEGATIVE   Ketones, ur NEGATIVE NEGATIVE mg/dL   Protein, ur NEGATIVE NEGATIVE mg/dL   Nitrite NEGATIVE NEGATIVE   Leukocytes, UA TRACE (A) NEGATIVE   RBC / HPF TOO NUMEROUS TO COUNT  0 - 5 RBC/hpf   WBC, UA 6-30 0 - 5 WBC/hpf   Bacteria, UA NONE SEEN NONE SEEN   Squamous Epithelial / LPF 6-30 (A) NONE SEEN   Mucus PRESENT    Hyaline Casts, UA PRESENT   hCG, quantitative, pregnancy   Collection Time: 10/16/16  2:20 PM  Result Value Ref Range   hCG, Beta Chain, Quant, S 12,782 (H) <5 mIU/mL  Results for orders placed or performed during the hospital encounter of 10/13/16 (from the past 336 hour(s))  Lipase, blood   Collection Time: 10/13/16  6:51 PM  Result Value Ref Range   Lipase 21 11 - 51 U/L  Comprehensive metabolic panel   Collection Time: 10/13/16  6:51 PM  Result Value Ref Range   Sodium 137 135 - 145 mmol/L   Potassium 4.0 3.5 - 5.1 mmol/L   Chloride 106 101 - 111 mmol/L   CO2 24 22 - 32 mmol/L   Glucose, Bld 84 65 - 99 mg/dL   BUN 14 6 - 20 mg/dL   Creatinine, Ser 1.61 0.44 - 1.00 mg/dL   Calcium 9.1 8.9 - 09.6 mg/dL   Total Protein 6.6 6.5 - 8.1 g/dL   Albumin 3.9 3.5 - 5.0 g/dL   AST 17 15 - 41 U/L   ALT 9 (L) 14 - 54 U/L   Alkaline Phosphatase 59 38 - 126 U/L   Total Bilirubin 0.5 0.3 - 1.2 mg/dL   GFR calc non Af Amer >60 >60 mL/min   GFR calc Af Amer >60 >60 mL/min   Anion gap 7 5 - 15  CBC   Collection Time: 10/13/16  6:51 PM  Result Value Ref Range   WBC 16.0 (H) 3.6 - 11.0 K/uL   RBC 3.82 3.80 - 5.20 MIL/uL   Hemoglobin 12.6 12.0 - 16.0 g/dL   HCT 04.5 40.9 - 81.1 %   MCV 97.1 80.0 - 100.0 fL   MCH 33.0 26.0 - 34.0 pg   MCHC 33.9 32.0 - 36.0 g/dL   RDW 91.4 78.2 - 95.6 %   Platelets  205 150 - 440 K/uL  Urinalysis, Complete w Microscopic   Collection Time: 10/13/16  6:51 PM  Result Value Ref Range   Color, Urine YELLOW (A) YELLOW   APPearance HAZY (A) CLEAR   Specific Gravity, Urine 1.014 1.005 - 1.030   pH 5.0 5.0 - 8.0   Glucose, UA NEGATIVE NEGATIVE mg/dL   Hgb urine dipstick LARGE (A) NEGATIVE   Bilirubin Urine NEGATIVE NEGATIVE   Ketones, ur NEGATIVE NEGATIVE mg/dL   Protein, ur NEGATIVE NEGATIVE mg/dL   Nitrite NEGATIVE NEGATIVE   Leukocytes, UA NEGATIVE NEGATIVE   RBC / HPF 6-30 0 - 5 RBC/hpf   WBC, UA 0-5 0 - 5 WBC/hpf   Bacteria, UA RARE (A) NONE SEEN   Squamous Epithelial / LPF 0-5 (A) NONE SEEN   Mucus PRESENT   hCG, quantitative, pregnancy   Collection Time: 10/13/16  6:51 PM  Result Value Ref Range   hCG, Beta Chain, Quant, S 93,005 (H) <5 mIU/mL  Wet prep, genital   Collection Time: 10/14/16  1:24 AM  Result Value Ref Range   Yeast Wet Prep HPF POC NONE SEEN NONE SEEN   Trich, Wet Prep NONE SEEN NONE SEEN   Clue Cells Wet Prep HPF POC NONE SEEN NONE SEEN   WBC, Wet Prep HPF POC MODERATE (A) NONE SEEN   Sperm NONE SEEN   Chlamydia/NGC rt PCR ([facility] only)  Collection Time: 10/14/16  1:24 AM  Result Value Ref Range   Specimen source GC/Chlam ENDOCERVICAL    Chlamydia Tr NOT DETECTED NOT DETECTED   N gonorrhoeae NOT DETECTED NOT DETECTED    Imaging Studies: Dg Abdomen 1 View  Result Date: 10/14/2016 CLINICAL DATA:  Abdominal pain. EXAM: ABDOMEN - 1 VIEW COMPARISON:  None. FINDINGS: Normal abdominal gas pattern. No biliary or urinary calculi. No free peritoneal air. IMPRESSION: Negative. Electronically Signed   By: Ellery Plunk M.D.   On: 10/14/2016 01:27   US Pelvis Transvanginal Non-ob (tv Only)  Result Date: 10/14/2016 CLINICAL DATA:  Acute onset of pelvic pain. Status post recent D & C. Initial encounter. EXAM: TRANSABDOMINAL AND TRANSVAGINAL ULTRASOUND OF PELVIS TECHNIQUE: Both transabdominal and transvaginal ultrasound  examinations of the pelvis were performed. Transabdominal technique was performed for global imaging of the pelvis including uterus, ovaries, adnexal regions, and pelvic cul-de-sac. It was necessary to proceed with endovaginal exam following the transabdominal exam to visualize the endometrium. COMPARISON:  Pelvic ultrasound performed 09/03/2016 FINDINGS: Uterus Measurements: 12.1 x 7.0 x 8.8 cm. No fibroids or other mass visualized. Endometrium Thickness: 4.9 cm. Heterogeneous material is noted filling the endometrial echo complex, likely reflecting clot. There is no definite evidence for retained products of conception on limited Doppler evaluation. Right ovary Measurements: 2.8 x 1.3 x 2.2 cm. Normal appearance/no adnexal mass. Left ovary Measurements: 3.6 x 2.0 x 1.8 cm. Normal appearance/no adnexal mass. Other findings No abnormal free fluid. IMPRESSION: 1. Heterogeneous material noted filling the endometrial complex, likely reflecting clot. No definite evidence for retained products of conception on limited Doppler evaluation. 2. No evidence for ovarian torsion. Electronically Signed   By: Roanna Raider M.D.   On: 10/14/2016 00:38   US Pelvis (transabdominal Only)  Result Date: 10/16/2016 CLINICAL DATA:  Right lower quadrant abdominal pain. Abortion on October 13th. Increasing pain over the past 3 days. Beta HCG measured at 12,782 today, 93,000 on 10/13/2016. EXAM: TRANSABDOMINAL ULTRASOUND OF PELVIS DOPPLER ULTRASOUND OF OVARIES TECHNIQUE: Transabdominal ultrasound examination of the pelvis was performed including evaluation of the uterus, ovaries, adnexal regions, and pelvic cul-de-sac. Color and duplex Doppler ultrasound was utilized to evaluate blood flow to the ovaries. COMPARISON:  None. FINDINGS: Uterus Measurements: 12.7 x 5.2 x 7.3 cm. No fibroids or other mass visualized. Endometrium Thickness: 5.5 cm. Diffusely thickened and heterogeneous in appearance, with associated vascularity. Right ovary  Measurements: 2.9 x 2.2 x 2.5 cm. Normal appearance/no adnexal mass. Left ovary Measurements: 2.4 x 1.3 x 2.6 cm. Normal appearance/no adnexal mass. Pulsed Doppler evaluation demonstrates normal low-resistance arterial and venous waveforms in both ovaries, arterial waveforms less well demonstrated on the right than on the left. No ovarian enlargement, peripheral follicles, adnexal free fluid or other secondary signs of ovarian torsion. IMPRESSION: 1. Endometrial complex is markedly thickened and heterogeneous with associated vascularity suggesting retained products of conception. This thickness may be accentuated by blood products distending the underlying endometrial canal. 2. Both ovaries appear normal. No evidence of ovarian torsion. No mass or free fluid fluid identified within either adnexal region. Electronically Signed   By: Bary Richard M.D.   On: 10/16/2016 16:08   US Pelvis Complete  Result Date: 10/14/2016 CLINICAL DATA:  Acute onset of pelvic pain. Status post recent D & C. Initial encounter. EXAM: TRANSABDOMINAL AND TRANSVAGINAL ULTRASOUND OF PELVIS TECHNIQUE: Both transabdominal and transvaginal ultrasound examinations of the pelvis were performed. Transabdominal technique was performed for global imaging of the pelvis including uterus, ovaries,  adnexal regions, and pelvic cul-de-sac. It was necessary to proceed with endovaginal exam following the transabdominal exam to visualize the endometrium. COMPARISON:  Pelvic ultrasound performed 09/03/2016 FINDINGS: Uterus Measurements: 12.1 x 7.0 x 8.8 cm. No fibroids or other mass visualized. Endometrium Thickness: 4.9 cm. Heterogeneous material is noted filling the endometrial echo complex, likely reflecting clot. There is no definite evidence for retained products of conception on limited Doppler evaluation. Right ovary Measurements: 2.8 x 1.3 x 2.2 cm. Normal appearance/no adnexal mass. Left ovary Measurements: 3.6 x 2.0 x 1.8 cm. Normal  appearance/no adnexal mass. Other findings No abnormal free fluid. IMPRESSION: 1. Heterogeneous material noted filling the endometrial complex, likely reflecting clot. No definite evidence for retained products of conception on limited Doppler evaluation. 2. No evidence for ovarian torsion. Electronically Signed   By: Roanna Raider M.D.   On: 10/14/2016 00:38   US Pelvic Doppler (torsion R/o Or Mass Arterial Flow)  Result Date: 10/16/2016 CLINICAL DATA:  Right lower quadrant abdominal pain. Abortion on October 13th. Increasing pain over the past 3 days. Beta HCG measured at 12,782 today, 93,000 on 10/13/2016. EXAM: TRANSABDOMINAL ULTRASOUND OF PELVIS DOPPLER ULTRASOUND OF OVARIES TECHNIQUE: Transabdominal ultrasound examination of the pelvis was performed including evaluation of the uterus, ovaries, adnexal regions, and pelvic cul-de-sac. Color and duplex Doppler ultrasound was utilized to evaluate blood flow to the ovaries. COMPARISON:  None. FINDINGS: Uterus Measurements: 12.7 x 5.2 x 7.3 cm. No fibroids or other mass visualized. Endometrium Thickness: 5.5 cm. Diffusely thickened and heterogeneous in appearance, with associated vascularity. Right ovary Measurements: 2.9 x 2.2 x 2.5 cm. Normal appearance/no adnexal mass. Left ovary Measurements: 2.4 x 1.3 x 2.6 cm. Normal appearance/no adnexal mass. Pulsed Doppler evaluation demonstrates normal low-resistance arterial and venous waveforms in both ovaries, arterial waveforms less well demonstrated on the right than on the left. No ovarian enlargement, peripheral follicles, adnexal free fluid or other secondary signs of ovarian torsion. IMPRESSION: 1. Endometrial complex is markedly thickened and heterogeneous with associated vascularity suggesting retained products of conception. This thickness may be accentuated by blood products distending the underlying endometrial canal. 2. Both ovaries appear normal. No evidence of ovarian torsion. No mass or free fluid  fluid identified within either adnexal region. Electronically Signed   By: Bary Richard M.D.   On: 10/16/2016 16:08   US Ob Less Than 14 Weeks With Ob Transvaginal  Result Date: 09/24/2016 CLINICAL DATA:  Left lower quadrant pain for 2 weeks .  Unknown LMP. EXAM: OBSTETRIC <14 WK [place] AND TRANSVAGINAL OB US TECHNIQUE: Both transabdominal and transvaginal ultrasound examinations were performed for complete evaluation of the gestation as well as the maternal uterus, adnexal regions, and pelvic cul-de-sac. Transvaginal technique was performed to assess early pregnancy. COMPARISON:  None. FINDINGS: Intrauterine gestational sac: Single Yolk sac:  Visualized. Embryo:  Not Visualized. MSD: 9  mm   5 w   5  d Subchorionic hemorrhage:  None visualized. Maternal uterus/adnexae: Both ovaries are normal in appearance. No mass or free fluid identified. IMPRESSION: Single intrauterine gestational sac measuring 5 weeks 5 days by mean sac diameter. Consider following quantitative beta HCG levels, with followup ultrasound to assess viability in 10-14 days. Electronically Signed   By: Myles Rosenthal M.D.   On: 09/24/2016 16:55    Assessment: 21 y.o. [id number] female status post elective abortion three days ago with retained products of conception that does not appear to be resolving based on ultrasound.   Plan: Patient will undergo surgical management with  suction, dilation and curettage.   The risks of surgery were discussed in detail with the patient including but not limited to: bleeding which may require transfusion or reoperation; infection which may require antibiotics; injury to surrounding organs which may involve bowel, bladder, ureters ; need for additional procedures including laparoscopy or laparotomy; thromboembolic phenomenon, surgical site problems and other postoperative/anesthesia complications. Likelihood of success in alleviating the patient's condition was discussed. Routine postoperative instructions will be  reviewed with the patient and her family in detail after surgery.  The patient concurred with the proposed plan, giving informed written consent for the surgery.  Patient has been NPO since last night she will remain NPO for procedure.  Anesthesia and OR aware.  Preoperative prophylactic antibiotics and SCDs ordered on call to the OR.  To OR when ready. I also discussed, in detail, alternatives to surgery, including; allowing more time for the products to pass spontaneously, medication to facilitate the products of conception to pass more quickly.  She understands the risks and benefits of each and elects a surgical route.   Thomasene Mohair, MD 10/16/2016 6:53 PM

## 2016-10-16 NOTE — Transfer of Care (Signed)
Immediate Anesthesia Transfer of Care Note  Patient: Theresa Fischer  Procedure(s) Performed: DILATATION AND EVACUATION (N/A Vagina )  Patient Location: PACU  Anesthesia Type:General  Level of Consciousness: awake, alert , oriented and patient cooperative  Airway & Oxygen Therapy: Patient Spontanous Breathing  Post-op Assessment: Report given to RN and Post -op Vital signs reviewed and stable  Post vital signs: Reviewed and stable  Last Vitals:  Vitals:   10/16/16 1849 10/16/16 2008  BP: 110/68 132/85  Pulse: 80 (!) 114  Resp: 16 19  Temp:  36.4 C  SpO2: 100% 98%    Last Pain:  Vitals:   10/16/16 1849  TempSrc:   PainSc: 4          Complications: No apparent anesthesia complications

## 2016-10-16 NOTE — ED Notes (Signed)
Attempted to call OR with no answer.  Pt transported to OR via orderly.

## 2016-10-16 NOTE — OR Nursing (Addendum)
Blood bank called for information related to pt's last rhogam injection. Per pt, rhogam was given on Saturday at [facility]. Pt has positive antibodies suggesting that dose of rhogam was given recently. Dr. Jean Rosenthal aware and states pt will not need additional rhogam at this time.

## 2016-10-17 ENCOUNTER — Encounter: Payer: Self-pay | Admitting: Obstetrics and Gynecology

## 2016-10-17 ENCOUNTER — Ambulatory Visit: Payer: Self-pay | Admitting: Obstetrics and Gynecology

## 2016-10-17 LAB — BPAM RBC
BLOOD PRODUCT EXPIRATION DATE: 201811052359
Blood Product Expiration Date: 201811052359
UNIT TYPE AND RH: 600
Unit Type and Rh: 600

## 2016-10-17 LAB — TYPE AND SCREEN
ABO/RH(D): A NEG
ANTIBODY SCREEN: POSITIVE
UNIT DIVISION: 0
Unit division: 0

## 2016-10-18 ENCOUNTER — Telehealth: Payer: Self-pay | Admitting: Obstetrics and Gynecology

## 2016-10-18 LAB — SURGICAL PATHOLOGY

## 2016-10-18 NOTE — Telephone Encounter (Signed)
Spoke with Theresa Fischer. Discussed the finding of a small (<401mm) globule of degenerated fat in the specimen from her Suction D&C.  Discussed that this could indicate a puncture to her uterus either during her suction aspiration done on Saturday 10/13 or during my procedure on 10/16.  The degenerative changes make me wonder if it didn't happen at the earlier procedure, as my specimen is placed in formalin immediately.  However, I told her I could not rule it out. She currently has less pain than prior, is having some bleeding (less than when she presented to the ER). She does report some soreness in her back and chest. She denies fevers, chills, nausea, emesis. She had a normal bowel movement yesterday and is passing flatus.  She is voiding normally. She is ambulating well. Her overall assessment of herself is "improved."   I gave her strict precautions regarding worsening abdominal pain, inability to have a bowel movement or pass flatus, nausea, vomiting, increased vaginal bleeding, or any other concerning symptom to go to the ER immediately. Overall, I am reassured enough based on the findings of only a very small degenerated fat globule on pathology along with the patients improving symptoms and normal bowel function and decreased abdominal pain to follow along expectantly. However, I have an extremely low threshold to get a contrast CT of her abdomen/pelvis to assess for bowel perforation.  I am less suspicious of vascular or nerve injury.  However, she was encouraged to call with any concerning symptoms and go to the ER for further evaluation and possible surgery.  She voiced understanding of the situation and my instructions.  She thanked me for letting her know. I will see her in follow up early next week to assess her condition in person or earlier, if needed.

## 2016-10-18 NOTE — Telephone Encounter (Signed)
Left generic voicemail to have patient call me asap and that it was very important that she call. Will discuss pathology findings and next steps.

## 2016-10-25 ENCOUNTER — Telehealth: Payer: Self-pay

## 2016-10-25 NOTE — Telephone Encounter (Signed)
-----   Message from Conard NovakStephen D Jackson, MD sent at 10/25/2016 11:59 AM EDT ----- Since she has not followed up since her surgery, would you mind calling her and making sure she is doing ok? I would prefer it if she scheduled an appointment with me to make sure she is doing ok. Thank you!

## 2016-10-25 NOTE — Telephone Encounter (Signed)
Please let me know when pt returns my call and get her scheduled with SDJ soon.

## 2016-10-29 ENCOUNTER — Telehealth: Payer: Self-pay

## 2016-10-29 NOTE — Telephone Encounter (Signed)
Pt states SDJ wanted her to let him know when she scheduled appt for f/u form D&E, has a hole in her uterus that may require more surgery.  Her appt is scheduled for Fri 11/02/16.  Please call pt and let her know if you are going to move up appt.  [phone]

## 2016-10-30 NOTE — Telephone Encounter (Signed)
appt should be fine 11/2

## 2016-11-01 ENCOUNTER — Telehealth: Payer: Self-pay

## 2016-11-01 NOTE — Telephone Encounter (Signed)
-----   Message from Stephen D Jackson, MD sent at 10/25/2016 11:59 AM EDT ----- Since she has not followed up since her surgery, would you mind calling her and making sure she is doing ok? I would prefer it if she scheduled an appointment with me to make sure she is doing ok. Thank you! 

## 2016-11-01 NOTE — Telephone Encounter (Signed)
Pt coming in 11/2

## 2016-11-02 ENCOUNTER — Ambulatory Visit: Payer: Medicaid Other | Admitting: Obstetrics and Gynecology

## 2017-02-01 ENCOUNTER — Emergency Department
Admission: EM | Admit: 2017-02-01 | Discharge: 2017-02-01 | Disposition: A | Discharge status: Home / Self Care | Payer: Medicaid Other | Attending: Emergency Medicine | Admitting: Emergency Medicine

## 2017-02-01 ENCOUNTER — Other Ambulatory Visit: Payer: Self-pay

## 2017-02-01 DIAGNOSIS — N390 Urinary tract infection, site not specified: Secondary | ICD-10-CM | POA: Diagnosis not present

## 2017-02-01 DIAGNOSIS — R1030 Lower abdominal pain, unspecified: Secondary | ICD-10-CM | POA: Diagnosis present

## 2017-02-01 DIAGNOSIS — R109 Unspecified abdominal pain: Secondary | ICD-10-CM

## 2017-02-01 LAB — COMPREHENSIVE METABOLIC PANEL
ALBUMIN: 4.4 g/dL (ref 3.5–5.0)
ALK PHOS: 82 U/L (ref 38–126)
ALT: 12 U/L — AB (ref 14–54)
AST: 22 U/L (ref 15–41)
Anion gap: 8 (ref 5–15)
BILIRUBIN TOTAL: 0.9 mg/dL (ref 0.3–1.2)
BUN: 12 mg/dL (ref 6–20)
CALCIUM: 9.7 mg/dL (ref 8.9–10.3)
CO2: 25 mmol/L (ref 22–32)
Chloride: 106 mmol/L (ref 101–111)
Creatinine, Ser: 0.89 mg/dL (ref 0.44–1.00)
GFR calc Af Amer: >60 mL/min (ref >60)
GFR calc non Af Amer: >60 mL/min (ref >60)
GLUCOSE: 82 mg/dL (ref 65–99)
Potassium: 4.5 mmol/L (ref 3.5–5.1)
SODIUM: 139 mmol/L (ref 135–145)
TOTAL PROTEIN: 7.5 g/dL (ref 6.5–8.1)

## 2017-02-01 LAB — CBC
HCT: 44.2 % (ref 35.0–47.0)
Hemoglobin: 14.8 g/dL (ref 12.0–16.0)
MCH: 32 pg (ref 26.0–34.0)
MCHC: 33.5 g/dL (ref 32.0–36.0)
MCV: 95.7 fL (ref 80.0–100.0)
Platelets: 201 10*3/uL (ref 150–440)
RBC: 4.62 MIL/uL (ref 3.80–5.20)
RDW: 13.5 % (ref 11.5–14.5)
WBC: 7.3 10*3/uL (ref 3.6–11.0)

## 2017-02-01 LAB — URINALYSIS, COMPLETE (UACMP) WITH MICROSCOPIC
BACTERIA UA: NONE SEEN
Bilirubin Urine: NEGATIVE
GLUCOSE, UA: NEGATIVE mg/dL
HGB URINE DIPSTICK: NEGATIVE
Ketones, ur: NEGATIVE mg/dL
NITRITE: NEGATIVE
PROTEIN: NEGATIVE mg/dL
SPECIFIC GRAVITY, URINE: 1.02 (ref 1.005–1.030)
pH: 7 (ref 5.0–8.0)

## 2017-02-01 LAB — LIPASE, BLOOD: Lipase: 30 U/L (ref 11–51)

## 2017-02-01 LAB — PREGNANCY, URINE: Preg Test, Ur: NEGATIVE

## 2017-02-01 MED ORDER — KETOROLAC TROMETHAMINE 60 MG/2ML IM SOLN: 60.0000 mg | Freq: Once | INTRAMUSCULAR | Status: DC

## 2017-02-01 MED ORDER — NAPROXEN 500 MG PO TABS: 500.0000 mg | ORAL_TABLET | Freq: Two times a day (BID) | ORAL | 0 refills | Status: DC | PRN

## 2017-02-01 MED ORDER — NITROFURANTOIN MONOHYD MACRO 100 MG PO CAPS: 100.0000 mg | ORAL_CAPSULE | Freq: Two times a day (BID) | ORAL | 0 refills | Status: AC

## 2017-02-01 NOTE — ED Triage Notes (Addendum)
Pt c/o ovary pain on the right but radiating to the left that started 4 days ago.  Pt states the pain is worse at night time.  Occasional diarrhea and nausea.  Pt states she had an abortion in October and had to have surgery to remove the placenta.

## 2017-02-01 NOTE — ED Notes (Signed)
Pt presents today for oravy pain. Pt states she had abortion in 08/2016 and had to have 2nd sx to remove placenta in 09/2016 Pt is NAD awaiting edp.

## 2017-02-01 NOTE — Discharge Instructions (Signed)
Please seek medical attention for any high fevers, chest pain, shortness of breath, change in behavior, persistent vomiting, bloody stool or any other new or concerning symptoms.  

## 2017-02-01 NOTE — ED Provider Notes (Signed)
[facility] Emergency Department Provider Note   ____________________________________________   I have reviewed the triage vital signs and the nursing notes.   HISTORY  Chief Complaint Abdominal Pain   History limited by: Not Limited   HPI Theresa Fischer is a 22 y.o. female who presents to the emergency department today because of concern for lower abdominal pain. It has been present for the past three days. Located in the right lower quadrant however now extending to the left side. She states that it somewhat reminds her of the pain she had when she was diagnosed with an ovarian cyst. She has had some associated nausea but no vomiting. She denies any fevers. The patient has not noticed any unusual vaginal discharge.     Per medical record review patient has a history of dilation and evacuation last year.  Past Medical History:  Diagnosis Date  . Bipolar 1 disorder (HCC)   . Depression    Bipolar  . Seasonal allergies     Patient Active Problem List   Diagnosis Date Noted  . Retained products of conception following abortion 10/16/2016  . Seasonal allergies   . Depression 12/10/2011  . Suicidal thoughts 12/10/2011  . Substance abuse (HCC) 12/10/2011    Past Surgical History:  Procedure Laterality Date  . DILATION AND EVACUATION N/A 10/16/2016   Procedure: DILATATION AND EVACUATION;  Surgeon: Conard NovakJackson, Stephen D, MD;  Location: [facility] ORS;  Service: Gynecology;  Laterality: N/A;  . MYRINGOTOMY    . NO PAST SURGERIES      Prior to Admission medications   Medication Sig Start Date End Date Taking? Authorizing Provider  HYDROcodone-acetaminophen (NORCO) 5-325 MG tablet Take 1 tablet by mouth every 6 (six) hours as needed for moderate pain. 10/16/16   Conard NovakJackson, Stephen D, MD  ibuprofen (ADVIL,MOTRIN) 600 MG tablet Take 1 tablet (600 mg total) by mouth every 6 (six) hours as needed. 10/16/16   Conard NovakJackson, Stephen D, MD  medroxyPROGESTERone  (DEPO-PROVERA) 150 MG/ML injection Inject 1 mL (150 mg total) into the muscle every 3 (three) months. 07/02/16   Tresea MallGledhill, Jane, CNM    Allergies Patient has no known allergies.  Family History  Problem Relation Age of Onset  . CAD Mother     Social History Social History   Tobacco Use  . Smoking status: Current Every Day Smoker    Packs/day: 0.10    Types: Cigarettes  . Smokeless tobacco: Never Used  Substance Use Topics  . Alcohol use: Yes    Comment: occ  . Drug use: No    Comment: Last use August 2016    Review of Systems Constitutional: No fever/chills Eyes: No visual changes. ENT: No sore throat. Cardiovascular: Denies chest pain. Respiratory: Denies shortness of breath. Gastrointestinal: Positive for abdominal pain and nausea.  Genitourinary: Negative for dysuria. Musculoskeletal: Negative for back pain. Skin: Negative for rash. Neurological: Negative for headaches, focal weakness or numbness.  ____________________________________________   PHYSICAL EXAM:  VITAL SIGNS: ED Triage Vitals  Enc Vitals Group     BP 02/01/17 1057 117/72     Pulse Rate 02/01/17 1057 80     Resp 02/01/17 1057 20     Temp 02/01/17 1057 97.7 F (36.5 C)     Temp Source 02/01/17 1057 Oral     SpO2 02/01/17 1057 100 %     Weight 02/01/17 1058 135 lb (61.2 kg)     Height 02/01/17 1058 5\' 2"  (1.575 m)     Head Circumference --  Peak Flow --      Pain Score 02/01/17 1102 3   Constitutional: Alert and oriented. Well appearing and in no distress. Eyes: Conjunctivae are normal.  ENT   Head: Normocephalic and atraumatic.   Nose: No congestion/rhinnorhea.   Mouth/Throat: Mucous membranes are moist.   Neck: No stridor. Hematological/Lymphatic/Immunilogical: No cervical lymphadenopathy. Cardiovascular: Normal rate, regular rhythm.  No murmurs, rubs, or gallops.  Respiratory: Normal respiratory effort without tachypnea nor retractions. Breath sounds are clear and  equal bilaterally. No wheezes/rales/rhonchi. Gastrointestinal: Soft and minimally tender in lower abdomin. No rebound. No guarding.  Genitourinary: Deferred Musculoskeletal: Normal range of motion in all extremities. No lower extremity edema. Neurologic:  Normal speech and language. No gross focal neurologic deficits are appreciated.  Skin:  Skin is warm, dry and intact. No rash noted. Psychiatric: Mood and affect are normal. Speech and behavior are normal. Patient exhibits appropriate insight and judgment.  ____________________________________________    LABS (pertinent positives/negatives)  CMP wnl except alt 12 UA moderate leukocytes, 6-30 wbcs, 6-30 rbcs Upreg negative CBC wnl  ____________________________________________   EKG  None  ____________________________________________    RADIOLOGY  None  ____________________________________________   PROCEDURES  Procedures  ____________________________________________   INITIAL IMPRESSION / ASSESSMENT AND PLAN / ED COURSE  Pertinent labs & imaging results that were available during my care of the patient were reviewed by me and considered in my medical decision making (see chart for details).  Presented to the emergency department today because of concerns for lower abdominal pain.  On exam patient in no acute distress.  Very minimal tenderness in the lower abdomen.  Differential would include ovarian issue, pregnancy, urinary tract infection, kidney stones amongst other etiologies.  The patient blood work without concerning findings.  Urine is concerning for possible UTI.  I did discussion with the patient about possibility of ovarian pathology.  Given lack of significant pain or tenderness I doubt ovarian torsion.  Additionally given lack of fever or significant pain or abnormal vaginal discharge doubt tubo-ovarian abscess.  Think at this point possible ovarian cyst with the patient has a history of.  Did discuss with  patient that we could obtain an ultrasound to better evaluate however if it was a cyst there is no specific treatment.  Patient felt comfortable deferring ultrasound at this time.  Will plan on treating for urinary tract infection and OB/GYN follow-up.   ____________________________________________   FINAL CLINICAL IMPRESSION(S) / ED DIAGNOSES  Final diagnoses:  Abdominal pain, unspecified abdominal location  Lower urinary tract infectious disease     Note: This dictation was prepared with Dragon dictation. Any transcriptional errors that result from this process are unintentional     Phineas Semen, MD 02/01/17 [phone]

## 2017-02-03 LAB — POCT PREGNANCY, URINE: PREG TEST UR: NEGATIVE

## 2017-05-11 ENCOUNTER — Ambulatory Visit
Admission: EM | Admit: 2017-05-11 | Discharge: 2017-05-11 | Disposition: A | Discharge status: Home / Self Care | Payer: Medicaid Other | Attending: Emergency Medicine | Admitting: Emergency Medicine

## 2017-05-11 ENCOUNTER — Other Ambulatory Visit: Payer: Self-pay

## 2017-05-11 ENCOUNTER — Emergency Department
Admission: EM | Admit: 2017-05-11 | Discharge: 2017-05-11 | Discharge status: Against Medical Advice | Payer: Self-pay | Attending: Emergency Medicine | Admitting: Emergency Medicine

## 2017-05-11 ENCOUNTER — Emergency Department: Payer: Self-pay

## 2017-05-11 ENCOUNTER — Encounter: Payer: Self-pay | Admitting: Emergency Medicine

## 2017-05-11 DIAGNOSIS — M25511 Pain in right shoulder: Secondary | ICD-10-CM | POA: Insufficient documentation

## 2017-05-11 DIAGNOSIS — N938 Other specified abnormal uterine and vaginal bleeding: Secondary | ICD-10-CM | POA: Insufficient documentation

## 2017-05-11 DIAGNOSIS — S8991XA Unspecified injury of right lower leg, initial encounter: Secondary | ICD-10-CM | POA: Insufficient documentation

## 2017-05-11 DIAGNOSIS — Y9289 Other specified places as the place of occurrence of the external cause: Secondary | ICD-10-CM | POA: Insufficient documentation

## 2017-05-11 DIAGNOSIS — Y9389 Activity, other specified: Secondary | ICD-10-CM | POA: Insufficient documentation

## 2017-05-11 DIAGNOSIS — Z79899 Other long term (current) drug therapy: Secondary | ICD-10-CM | POA: Insufficient documentation

## 2017-05-11 DIAGNOSIS — F17210 Nicotine dependence, cigarettes, uncomplicated: Secondary | ICD-10-CM | POA: Insufficient documentation

## 2017-05-11 DIAGNOSIS — S80211D Abrasion, right knee, subsequent encounter: Secondary | ICD-10-CM

## 2017-05-11 DIAGNOSIS — Y999 Unspecified external cause status: Secondary | ICD-10-CM | POA: Insufficient documentation

## 2017-05-11 MED ORDER — ACETAMINOPHEN 325 MG PO TABS: 650.0000 mg | ORAL_TABLET | Freq: Once | ORAL | Status: DC

## 2017-05-11 MED ORDER — ACETAMINOPHEN 325 MG PO TABS
ORAL_TABLET | ORAL | Status: AC
  Filled 2017-05-11: qty 2
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
## 2017-05-11 NOTE — ED Notes (Signed)
Pt met in hallway speaking with Dr Corky Downs, pt states that she isn't staying if Opal Sidles cont to be her nurse, that she is giving her tylenol for pain and shaking and has an attitude, that she had to call 2 times before anyone would even come into her room. Pt reassured that this RN would do what I could to help her, but that I couldn't be her new nurse at this time. Pt states that this is crazy, that we aren't helping her, pt looks at friend and asks what should she do, friend encouraged pt to leave. Pt cursing and leaving down the hall.
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
## 2017-05-11 NOTE — ED Notes (Addendum)
Responded to call light to patient's room at around 1005 and initially when I arrived to room patient's friend stated "Why did we call her again?".  Patient c/o knot to right side of head, continued vaginal bleeding and headache.  Asked patient if she would like a femine pad and informed her that I would alert Dr. Cyril Loosen about knot on head and head pain.  Patient waiting in the hall upon my return to room with ordered tylenol and pad.  Patient using a raised voice, complaining about the time it took for me to return to room with pain medication and stating that she had already been here for two hours and nothing was being done for her.  Sequence of care reviewed with patient and I apologized that she was waiting for X-Ray.  Also informed patient that the Nurse Examiner (SANE) had been called and was currently at another facility with another patient, but she would be here after she was finished there, but I informed patient that she would be in the ED for a while today.  Patient's friend then informed me that patient was having hot and cold flashes and that she was shaking.  I stated that we were going to start by giving her tylenol for her discomfort to see if that helped her symptoms, but if it did not help, then we would go from there.  Patient's friend stated that Tylenol is not helpful for hot flashes and shaking, and I responded that it can help if those symptoms are related to pain.  Patient then stated that tylenol will not help with this pain.  Both patient and friend stated their dissatisfaction with my care as there nurse and stood up to leave the hostial.  Dr. Cyril Loosen and Gearldine Bienenstock notified and attempted to convince patient to stay and complete evaluation.

## 2017-05-11 NOTE — ED Notes (Signed)
1 gram of Tylenol given to patient at 11:39 per verbal order from Dr. Chaney Malling
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
## 2017-10-20 ENCOUNTER — Other Ambulatory Visit: Payer: Self-pay

## 2017-10-20 ENCOUNTER — Emergency Department: Payer: Medicaid Other

## 2017-10-20 ENCOUNTER — Emergency Department
Admission: EM | Admit: 2017-10-20 | Discharge: 2017-10-20 | Disposition: A | Discharge status: Home / Self Care | Payer: Medicaid Other | Attending: Emergency Medicine | Admitting: Emergency Medicine

## 2017-10-20 DIAGNOSIS — Z79899 Other long term (current) drug therapy: Secondary | ICD-10-CM | POA: Diagnosis not present

## 2017-10-20 DIAGNOSIS — R197 Diarrhea, unspecified: Secondary | ICD-10-CM | POA: Diagnosis not present

## 2017-10-20 DIAGNOSIS — F17210 Nicotine dependence, cigarettes, uncomplicated: Secondary | ICD-10-CM | POA: Diagnosis not present

## 2017-10-20 DIAGNOSIS — R112 Nausea with vomiting, unspecified: Secondary | ICD-10-CM | POA: Diagnosis not present

## 2017-10-20 DIAGNOSIS — J189 Pneumonia, unspecified organism: Secondary | ICD-10-CM | POA: Diagnosis not present

## 2017-10-20 DIAGNOSIS — R1084 Generalized abdominal pain: Secondary | ICD-10-CM

## 2017-10-20 DIAGNOSIS — J181 Lobar pneumonia, unspecified organism: Secondary | ICD-10-CM

## 2017-10-20 DIAGNOSIS — R509 Fever, unspecified: Secondary | ICD-10-CM | POA: Diagnosis present

## 2017-10-20 LAB — URINALYSIS, COMPLETE (UACMP) WITH MICROSCOPIC
Bilirubin Urine: NEGATIVE
GLUCOSE, UA: NEGATIVE mg/dL
HGB URINE DIPSTICK: NEGATIVE
Ketones, ur: 40 mg/dL — AB
Leukocytes, UA: NEGATIVE
NITRITE: NEGATIVE
PH: 6 (ref 5.0–8.0)
Protein, ur: NEGATIVE mg/dL
Specific Gravity, Urine: 1.02 (ref 1.005–1.030)

## 2017-10-20 LAB — CBC
HCT: 39.2 % (ref 36.0–46.0)
HEMOGLOBIN: 13.1 g/dL (ref 12.0–15.0)
MCH: 32.6 pg (ref 26.0–34.0)
MCHC: 33.4 g/dL (ref 30.0–36.0)
MCV: 97.5 fL (ref 80.0–100.0)
PLATELETS: 274 10*3/uL (ref 150–400)
RBC: 4.02 MIL/uL (ref 3.87–5.11)
RDW: 11.8 % (ref 11.5–15.5)
WBC: 16.3 10*3/uL — AB (ref 4.0–10.5)
nRBC: 0 % (ref 0.0–0.2)

## 2017-10-20 LAB — POCT PREGNANCY, URINE: Preg Test, Ur: NEGATIVE

## 2017-10-20 LAB — HEPATIC FUNCTION PANEL
ALBUMIN: 4.2 g/dL (ref 3.5–5.0)
ALK PHOS: 64 U/L (ref 38–126)
ALT: 12 U/L (ref 0–44)
AST: 18 U/L (ref 15–41)
BILIRUBIN TOTAL: 0.9 mg/dL (ref 0.3–1.2)
Bilirubin, Direct: 0.1 mg/dL (ref 0.0–0.2)
Indirect Bilirubin: 0.8 mg/dL (ref 0.3–0.9)
Total Protein: 7.5 g/dL (ref 6.5–8.1)

## 2017-10-20 LAB — INFLUENZA PANEL BY PCR (TYPE A & B)
Influenza A By PCR: NEGATIVE
Influenza B By PCR: NEGATIVE

## 2017-10-20 LAB — BASIC METABOLIC PANEL
ANION GAP: 7 (ref 5–15)
BUN: 8 mg/dL (ref 6–20)
CALCIUM: 9.8 mg/dL (ref 8.9–10.3)
CO2: 23 mmol/L (ref 22–32)
Chloride: 106 mmol/L (ref 98–111)
Creatinine, Ser: 0.89 mg/dL (ref 0.44–1.00)
GFR calc Af Amer: >60 mL/min (ref >60)
Glucose, Bld: 122 mg/dL — ABNORMAL HIGH (ref 70–99)
POTASSIUM: 3.9 mmol/L (ref 3.5–5.1)
SODIUM: 136 mmol/L (ref 135–145)

## 2017-10-20 LAB — LIPASE, BLOOD: Lipase: 26 U/L (ref 11–51)

## 2017-10-20 MED ORDER — IOPAMIDOL (ISOVUE-300) INJECTION 61%
80.0000 mL | Freq: Once | INTRAVENOUS | Status: AC | PRN
  Administered 2017-10-20: 80 mL via INTRAVENOUS

## 2017-10-20 MED ORDER — IOPAMIDOL (ISOVUE-300) INJECTION 61%
15.0000 mL | INTRAVENOUS | Status: AC
  Administered 2017-10-20 (×2): 15 mL via ORAL

## 2017-10-20 MED ORDER — ONDANSETRON HCL 4 MG/2ML IJ SOLN
4.0000 mg | Freq: Once | INTRAMUSCULAR | Status: AC
  Administered 2017-10-20: 4 mg via INTRAVENOUS
  Filled 2017-10-20: qty 2

## 2017-10-20 MED ORDER — ONDANSETRON HCL 4 MG PO TABS: 4.0000 mg | ORAL_TABLET | Freq: Every day | ORAL | 0 refills | Status: DC | PRN

## 2017-10-20 MED ORDER — DOXYCYCLINE HYCLATE 100 MG PO TABS
100.0000 mg | ORAL_TABLET | Freq: Once | ORAL | Status: AC
  Administered 2017-10-20: 100 mg via ORAL
  Filled 2017-10-20: qty 1

## 2017-10-20 MED ORDER — SODIUM CHLORIDE 0.9 % IV BOLUS
1000.0000 mL | Freq: Once | INTRAVENOUS | Status: AC
  Administered 2017-10-20: 1000 mL via INTRAVENOUS

## 2017-10-20 MED ORDER — DOXYCYCLINE HYCLATE 100 MG PO CAPS: 100.0000 mg | ORAL_CAPSULE | Freq: Two times a day (BID) | ORAL | 0 refills | Status: DC

## 2017-10-20 NOTE — ED Notes (Signed)
ED Provider at bedside. 

## 2017-10-20 NOTE — ED Notes (Signed)
Unable to print labels due to being denied access. Two other staff tried as well and same issue. Called lab and spoke with Lamont. He stated it was ok to send chart labels. Will inform Charge RN Erskine Squibb.

## 2017-10-20 NOTE — ED Triage Notes (Signed)
Pt comes via POV from home with c/o vomiting, fever, and generalized aches and pains. Pt states she has been feeling like she is going to pass out. Pt states this has all been ongoing for about 2 weeks and is getting worse.

## 2017-10-20 NOTE — ED Provider Notes (Signed)
[facility] Emergency Department Provider Note  ___________________________________________   First MD Initiated Contact with Patient 10/20/17 1503     (approximate)  I have reviewed the triage vital signs and the nursing notes.   HISTORY  Chief Complaint Fever; Weakness; and Emesis   HPI Theresa Fischer is a 22 y.o. female with a history of bipolar disorder who was presented to the emergency department with 2 weeks of cough, ear pressure and now over the past 2 days his nausea vomiting, diarrhea and abdominal aching.  Says that she also has intermittent fever.  Denies any known sick contacts.  Denies any burning with urination.  Does not report any vaginal bleeding or discharge.  Says that she smokes a pack of cigarettes a last approximately 4 days.  Also with increasing weakness over the past 2 weeks which is generalized.  Says that her symptoms began after having a flu shot.   Past Medical History:  Diagnosis Date  . Bipolar 1 disorder (HCC)   . Depression    Bipolar  . Seasonal allergies     Patient Active Problem List   Diagnosis Date Noted  . Retained products of conception following abortion 10/16/2016  . Seasonal allergies   . Depression 12/10/2011  . Suicidal thoughts 12/10/2011  . Substance abuse (HCC) 12/10/2011    Past Surgical History:  Procedure Laterality Date  . DILATION AND EVACUATION N/A 10/16/2016   Procedure: DILATATION AND EVACUATION;  Surgeon: Conard Novak, MD;  Location: [facility] ORS;  Service: Gynecology;  Laterality: N/A;  . MYRINGOTOMY    . NO PAST SURGERIES      Prior to Admission medications   Medication Sig Start Date End Date Taking? Authorizing Provider  medroxyPROGESTERone (DEPO-PROVERA) 150 MG/ML injection Inject 1 mL (150 mg total) into the muscle every 3 (three) months. 07/02/16   Tresea Mall, CNM    Allergies Patient has no known allergies.  Family History  Problem Relation Age of Onset  . CAD  Mother     Social History Social History   Tobacco Use  . Smoking status: Current Every Day Smoker    Packs/day: 0.10    Types: Cigarettes  . Smokeless tobacco: Never Used  Substance Use Topics  . Alcohol use: Yes    Comment: occ  . Drug use: No    Types: Marijuana    Comment: Last use August 2016    Review of Systems  Constitutional: As above Eyes: No visual changes. ENT: As above Cardiovascular: Denies chest pain. Respiratory: Denies shortness of breath. Gastrointestinal:  No constipation. Genitourinary: Negative for dysuria. Musculoskeletal: Body aches Skin: Negative for rash. Neurological: Negative for headaches, focal weakness or numbness.   ____________________________________________   PHYSICAL EXAM:  VITAL SIGNS: ED Triage Vitals  Enc Vitals Group     BP 10/20/17 1409 122/64     Pulse Rate 10/20/17 1409 (!) 105     Resp 10/20/17 1409 18     Temp 10/20/17 1409 98.3 F (36.8 C)     Temp Source 10/20/17 1409 Oral     SpO2 10/20/17 1409 100 %     Weight 10/20/17 1407 117 lb (53.1 kg)     Height 10/20/17 1407 5\' 2"  (1.575 m)     Head Circumference --      Peak Flow --      Pain Score 10/20/17 1407 10     Pain Loc --      Pain Edu? --  Excl. in GC? --     Constitutional: Alert and oriented. Well appearing and in no acute distress.  Patient coughing multiple times throughout the examination.   Eyes: Conjunctivae are normal.  Head: Atraumatic.  Normal TMs bilaterally.   Nose: No congestion/rhinnorhea. Mouth/Throat: Mucous membranes are moist.  No pharyngeal erythema.  No tonsillar swelling or exudate. Neck: No stridor.   Cardiovascular: Normal rate, regular rhythm. Grossly normal heart sounds.   Respiratory: Normal respiratory effort.  No retractions. Lungs CTAB. Gastrointestinal: Soft with mild and diffuse tenderness to palpation. No distention.  Musculoskeletal: No lower extremity tenderness nor edema.  No joint effusions. Neurologic:  Normal  speech and language. No gross focal neurologic deficits are appreciated. Skin:  Skin is warm, dry and intact. No rash noted. Psychiatric: Mood and affect are normal. Speech and behavior are normal.  ____________________________________________   LABS (all labs ordered are listed, but only abnormal results are displayed)  Labs Reviewed  BASIC METABOLIC PANEL - Abnormal; Notable for the following components:      Result Value   Glucose, Bld 122 (*)    All other components within normal limits  CBC - Abnormal; Notable for the following components:   WBC 16.3 (*)    All other components within normal limits  URINALYSIS, COMPLETE (UACMP) WITH MICROSCOPIC - Abnormal; Notable for the following components:   Ketones, ur 40 (*)    Bacteria, UA RARE (*)    All other components within normal limits  INFLUENZA PANEL BY PCR (TYPE A & B)  HEPATIC FUNCTION PANEL  LIPASE, BLOOD  POC URINE PREG, ED  POCT PREGNANCY, URINE   ____________________________________________  EKG  ED ECG REPORT I, Arelia Longest, the attending physician, personally viewed and interpreted this ECG.   Date: 10/20/2017  EKG Time: 1413  Rate: 106  Rhythm: sinus tachycardia  Axis: Rightward  Intervals:none  ST&T Change: No ST segment elevation or depression.  No abnormal T wave inversion.  ____________________________________________  RADIOLOGY  Lower lobe pneumonia detected on CAT scan of the abdomen and pelvis.  No evidence of acute abnormalities in the abdomen and pelvis. ____________________________________________   PROCEDURES  Procedure(s) performed:   Procedures  Critical Care performed:   ____________________________________________   INITIAL IMPRESSION / ASSESSMENT AND PLAN / ED COURSE  Pertinent labs & imaging results that were available during my care of the patient were reviewed by me and considered in my medical decision making (see chart for details).  Differential diagnosis  includes, but is not limited to, ovarian cyst, ovarian torsion, acute appendicitis, diverticulitis, urinary tract infection/pyelonephritis, endometriosis, bowel obstruction, colitis, renal colic, gastroenteritis, hernia, fibroids, endometriosis, pregnancy related pain including ectopic pregnancy, etc. As part of my medical decision making, I reviewed the following data within the electronic MEDICAL RECORD NUMBER Notes from prior ED visits  ----------------------------------------- 6:21 PM on 10/20/2017 -----------------------------------------  Patient at this time with a heart rate of 88 bpm.  Temperature is now 98.4.  Discussed pneumonia diagnosis with the patient.  Will discharge with doxycycline as well as Zofran.  Patient understanding of the diagnosis of pneumonia as well as treatment plan willing to comply.  To return for any worsening or concerning symptoms.  Patient with elevated white blood cell count.  However, continues to be afebrile.  Able to tolerate p.o. in the emergency department without issue. ____________________________________________   FINAL CLINICAL IMPRESSION(S) / ED DIAGNOSES  Community acquired pneumonia.  Abdominal pain with nausea and vomiting and diarrhea.  NEW MEDICATIONS STARTED DURING THIS  VISIT:  New Prescriptions   No medications on file     Note:  This document was prepared using Dragon voice recognition software and may include unintentional dictation errors.     Myrna Blazer, MD 10/20/17 Rickey Primus

## 2017-10-22 ENCOUNTER — Other Ambulatory Visit: Payer: Self-pay

## 2017-10-22 ENCOUNTER — Encounter: Payer: Self-pay | Admitting: Emergency Medicine

## 2017-10-22 ENCOUNTER — Emergency Department
Admission: EM | Admit: 2017-10-22 | Discharge: 2017-10-22 | Disposition: A | Discharge status: Home / Self Care | Payer: Medicaid Other | Attending: Emergency Medicine | Admitting: Emergency Medicine

## 2017-10-22 DIAGNOSIS — Z79899 Other long term (current) drug therapy: Secondary | ICD-10-CM | POA: Insufficient documentation

## 2017-10-22 DIAGNOSIS — B009 Herpesviral infection, unspecified: Secondary | ICD-10-CM

## 2017-10-22 DIAGNOSIS — F17210 Nicotine dependence, cigarettes, uncomplicated: Secondary | ICD-10-CM | POA: Insufficient documentation

## 2017-10-22 DIAGNOSIS — R21 Rash and other nonspecific skin eruption: Secondary | ICD-10-CM | POA: Diagnosis present

## 2017-10-22 MED ORDER — PENCICLOVIR 1 % EX CREA: 1.0000 "application " | TOPICAL_CREAM | CUTANEOUS | 0 refills | Status: DC

## 2017-10-22 NOTE — ED Notes (Signed)
Patient called to say the denavir is very expensive.  [address]ed [facility] [address] and they said the rx sent is not made anymore and the larger tube would be $900.  They do not have any other topical antiviral at that store.  They did check other stores and told me that the acyclovir is available at [facility].  Per Durward Parcel PA--Called acyclovir ointment 5% 15 G tube --apply to affected area 5 times a day--to [facility] [address].  Called patient and informed her of where medication is.

## 2017-10-22 NOTE — ED Provider Notes (Signed)
[facility] Emergency Department Provider Note   ____________________________________________   First MD Initiated Contact with Patient 10/22/17 1053     (approximate)  I have reviewed the triage vital signs and the nursing notes.   HISTORY  Chief Complaint Rash    HPI Theresa Fischer is a 22 y.o. female patient presents to ED complaining of itchy/burning rash to the perioral area upon a.m. awakening.  Patient was seen here 2 days ago and placed on doxycycline for pneumonia.  No palliative measure for complaint.  Past Medical History:  Diagnosis Date  . Bipolar 1 disorder (HCC)   . Depression    Bipolar  . Seasonal allergies     Patient Active Problem List   Diagnosis Date Noted  . Retained products of conception following abortion 10/16/2016  . Seasonal allergies   . Depression 12/10/2011  . Suicidal thoughts 12/10/2011  . Substance abuse (HCC) 12/10/2011    Past Surgical History:  Procedure Laterality Date  . DILATION AND EVACUATION N/A 10/16/2016   Procedure: DILATATION AND EVACUATION;  Surgeon: Conard Novak, MD;  Location: [facility] ORS;  Service: Gynecology;  Laterality: N/A;  . MYRINGOTOMY    . NO PAST SURGERIES      Prior to Admission medications   Medication Sig Start Date End Date Taking? Authorizing Provider  doxycycline (VIBRAMYCIN) 100 MG capsule Take 100 mg by mouth 2 (two) times daily.   Yes [provider]  ondansetron (ZOFRAN) 4 MG tablet Take 4 mg by mouth every 8 (eight) hours as needed for nausea or vomiting.   Yes [provider]  doxycycline (VIBRAMYCIN) 100 MG capsule Take 1 capsule (100 mg total) by mouth 2 (two) times daily. 10/20/17   Myrna Blazer, MD  medroxyPROGESTERone (DEPO-PROVERA) 150 MG/ML injection Inject 1 mL (150 mg total) into the muscle every 3 (three) months. 07/02/16   Tresea Mall, CNM  ondansetron (ZOFRAN) 4 MG tablet Take 1 tablet (4 mg total) by mouth daily as  needed. 10/20/17   Schaevitz, Myra Rude, MD  penciclovir (DENAVIR) 1 % cream Apply 1 application topically every 2 (two) hours. 10/22/17   Joni Reining, PA-C    Allergies Patient has no known allergies.  Family History  Problem Relation Age of Onset  . CAD Mother     Social History Social History   Tobacco Use  . Smoking status: Current Every Day Smoker    Packs/day: 0.10    Types: Cigarettes  . Smokeless tobacco: Never Used  Substance Use Topics  . Alcohol use: Yes    Comment: occ  . Drug use: No    Types: Marijuana    Comment: Last use August 2016    Review of Systems Constitutional: No fever/chills Eyes: No visual changes. ENT: No sore throat. Cardiovascular: Denies chest pain. Respiratory: Denies shortness of breath. Gastrointestinal: No abdominal pain.  No nausea, no vomiting.  No diarrhea.  No constipation. Genitourinary: Negative for dysuria. Musculoskeletal: Negative for back pain. Skin: Positive for periorbital rash. Neurological: Negative for headaches, focal weakness or numbness. Endocrine:Bipolar and depression.  ____________________________________________   PHYSICAL EXAM:  VITAL SIGNS: ED Triage Vitals  Enc Vitals Group     BP 10/22/17 1044 109/71     Pulse Rate 10/22/17 1044 98     Resp 10/22/17 1044 16     Temp 10/22/17 1044 98 F (36.7 C)     Temp Source 10/22/17 1044 Oral     SpO2 10/22/17 1044 98 %  Weight 10/22/17 1045 119 lb (54 kg)     Height 10/22/17 1045 5\' 2"  (1.575 m)     Head Circumference --      Peak Flow --      Pain Score 10/22/17 1045 0     Pain Loc --      Pain Edu? --      Excl. in GC? --    Constitutional: Alert and oriented. Well appearing and in no acute distress. Eyes: Conjunctivae are normal. PERRL. EOMI. Head: Atraumatic. Nose: No congestion/rhinnorhea. Mouth/Throat: Mucous membranes are moist.  Oropharynx non-erythematous. Neck: No stridor. Hematological/Lymphatic/Immunilogical: No cervical  lymphadenopathy. Cardiovascular: Normal rate, regular rhythm. Grossly normal heart sounds.  Good peripheral circulation. Respiratory: Normal respiratory effort.  No retractions. Lungs CTAB. Skin:  Skin is warm, dry and intact.  Vesicular lesions perioral area. ____________________________________________   LABS (all labs ordered are listed, but only abnormal results are displayed)  Labs Reviewed - No data to display ____________________________________________  EKG   ____________________________________________  RADIOLOGY  ED MD interpretation:    Official radiology report(s): No results found.  ____________________________________________   PROCEDURES  Procedure(s) performed: None  Procedures  Critical Care performed: No  ____________________________________________   INITIAL IMPRESSION / ASSESSMENT AND PLAN / ED COURSE  As part of my medical decision making, I reviewed the following data within the electronic MEDICAL RECORD NUMBER    Physical lesion perioral area consistent with herpes simplex 1.  Patient given discharge care instructions.  Patient advised continue previous medication and start Denavir as directed.  Patient given a work note.      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    Joni Reining, PA-C 10/22/17 1110    Governor Rooks, MD 10/22/17 [phone]

## 2017-10-22 NOTE — ED Triage Notes (Signed)
Patient comes to ED complaining of rash to face starting this AM.  Seen here on Monday, and placed on "something "cillin" it starts with a D.  Last dose last PM.  Questions if she may be allergic.  Rash visible on face around mouth and nose.  "It's so itchy".  Denies any new symptoms except for rash.  Alert and oriented.  Color good.

## 2017-10-22 NOTE — ED Notes (Signed)
See triage note  Presents with rash which started after starting doxy and zofran on Monday  Positive itching  No resp distress

## 2017-10-29 ENCOUNTER — Other Ambulatory Visit: Payer: Self-pay

## 2017-10-29 ENCOUNTER — Emergency Department: Payer: Medicaid Other

## 2017-10-29 ENCOUNTER — Emergency Department
Admission: EM | Admit: 2017-10-29 | Discharge: 2017-10-29 | Disposition: A | Discharge status: Home / Self Care | Payer: Medicaid Other | Attending: Emergency Medicine | Admitting: Emergency Medicine

## 2017-10-29 ENCOUNTER — Encounter: Payer: Self-pay | Admitting: Emergency Medicine

## 2017-10-29 DIAGNOSIS — J189 Pneumonia, unspecified organism: Secondary | ICD-10-CM

## 2017-10-29 DIAGNOSIS — F17210 Nicotine dependence, cigarettes, uncomplicated: Secondary | ICD-10-CM | POA: Insufficient documentation

## 2017-10-29 DIAGNOSIS — J181 Lobar pneumonia, unspecified organism: Secondary | ICD-10-CM | POA: Diagnosis not present

## 2017-10-29 DIAGNOSIS — R0789 Other chest pain: Secondary | ICD-10-CM | POA: Insufficient documentation

## 2017-10-29 DIAGNOSIS — R079 Chest pain, unspecified: Secondary | ICD-10-CM | POA: Diagnosis present

## 2017-10-29 LAB — CBC
HCT: 42 % (ref 36.0–46.0)
Hemoglobin: 13.7 g/dL (ref 12.0–15.0)
MCH: 32 pg (ref 26.0–34.0)
MCHC: 32.6 g/dL (ref 30.0–36.0)
MCV: 98.1 fL (ref 80.0–100.0)
NRBC: 0 % (ref 0.0–0.2)
Platelets: 312 10*3/uL (ref 150–400)
RBC: 4.28 MIL/uL (ref 3.87–5.11)
RDW: 12 % (ref 11.5–15.5)
WBC: 11.8 10*3/uL — AB (ref 4.0–10.5)

## 2017-10-29 LAB — FIBRIN DERIVATIVES D-DIMER (ARMC ONLY): FIBRIN DERIVATIVES D-DIMER (ARMC): 779.68 ng{FEU}/mL — AB (ref 0.00–499.00)

## 2017-10-29 LAB — BASIC METABOLIC PANEL
Anion gap: 11 (ref 5–15)
BUN: 8 mg/dL (ref 6–20)
CO2: 22 mmol/L (ref 22–32)
CREATININE: 0.78 mg/dL (ref 0.44–1.00)
Calcium: 9.1 mg/dL (ref 8.9–10.3)
Chloride: 108 mmol/L (ref 98–111)
Glucose, Bld: 91 mg/dL (ref 70–99)
Potassium: 3.8 mmol/L (ref 3.5–5.1)
SODIUM: 141 mmol/L (ref 135–145)

## 2017-10-29 LAB — TROPONIN I

## 2017-10-29 MED ORDER — IBUPROFEN 800 MG PO TABS
800.0000 mg | ORAL_TABLET | Freq: Once | ORAL | Status: AC
  Administered 2017-10-29: 800 mg via ORAL
  Filled 2017-10-29: qty 1

## 2017-10-29 MED ORDER — LEVOFLOXACIN 750 MG PO TABS: 750.0000 mg | ORAL_TABLET | Freq: Every day | ORAL | 0 refills | Status: DC

## 2017-10-29 MED ORDER — SODIUM CHLORIDE 0.9 % IV SOLN
1.0000 g | Freq: Once | INTRAVENOUS | Status: AC
  Administered 2017-10-29: 1 g via INTRAVENOUS
  Filled 2017-10-29: qty 10

## 2017-10-29 MED ORDER — IOPAMIDOL (ISOVUE-370) INJECTION 76%
75.0000 mL | Freq: Once | INTRAVENOUS | Status: AC | PRN
  Administered 2017-10-29: 75 mL via INTRAVENOUS
  Filled 2017-10-29: qty 75

## 2017-10-29 MED ORDER — HYDROCOD POLST-CPM POLST ER 10-8 MG/5ML PO SUER: 5.0000 mL | Freq: Two times a day (BID) | ORAL | 0 refills | Status: DC

## 2017-10-29 NOTE — ED Provider Notes (Signed)
[facility] Emergency Department Provider Note       Time seen: ----------------------------------------- 6:48 PM on 10/29/2017 -----------------------------------------   I have reviewed the triage vital signs and the nursing notes.  HISTORY   Chief Complaint Chest Pain    HPI Theresa Fischer is a 22 y.o. female with a history of bipolar disorder, depression, seasonal allergy who presents to the ED for midsternal sharp chest pain that started this morning.  Patient states the pain is constant.  Nothing makes it better or worse.  Patient states she recently was diagnosed with pneumonia.  Past Medical History:  Diagnosis Date  . Bipolar 1 disorder (HCC)   . Depression    Bipolar  . Seasonal allergies     Patient Active Problem List   Diagnosis Date Noted  . Retained products of conception following abortion 10/16/2016  . Seasonal allergies   . Depression 12/10/2011  . Suicidal thoughts 12/10/2011  . Substance abuse (HCC) 12/10/2011    Past Surgical History:  Procedure Laterality Date  . DILATION AND EVACUATION N/A 10/16/2016   Procedure: DILATATION AND EVACUATION;  Surgeon: Conard Novak, MD;  Location: [facility] ORS;  Service: Gynecology;  Laterality: N/A;  . MYRINGOTOMY    . NO PAST SURGERIES      Allergies Patient has no known allergies.  Social History Social History   Tobacco Use  . Smoking status: Current Every Day Smoker    Packs/day: 0.10    Types: Cigarettes  . Smokeless tobacco: Never Used  Substance Use Topics  . Alcohol use: Yes    Comment: occ  . Drug use: No    Types: Marijuana    Comment: Last use August 2016   Review of Systems Constitutional: Negative for fever. Cardiovascular: Positive for chest pain Respiratory: Negative for shortness of breath. Gastrointestinal: Negative for abdominal pain, vomiting and diarrhea. Musculoskeletal: Negative for back pain. Skin: Negative for rash. Neurological: Negative  for headaches, focal weakness or numbness.  All systems negative/normal/unremarkable except as stated in the HPI  ____________________________________________   PHYSICAL EXAM:  VITAL SIGNS: ED Triage Vitals [10/29/17 1659]  Enc Vitals Group     BP (!) 109/93     Pulse Rate 99     Resp 20     Temp 98.7 F (37.1 C)     Temp Source Oral     SpO2 99 %     Weight 117 lb (53.1 kg)     Height 5\' 2"  (1.575 m)     Head Circumference      Peak Flow      Pain Score 9     Pain Loc      Pain Edu?      Excl. in GC?    Constitutional: Alert and oriented. Well appearing and in no distress. Eyes: Conjunctivae are normal. Normal extraocular movements. ENT   Head: Normocephalic and atraumatic.   Nose: No congestion/rhinnorhea.   Mouth/Throat: Mucous membranes are moist.   Neck: No stridor. Cardiovascular: Normal rate, regular rhythm. No murmurs, rubs, or gallops. Respiratory: Normal respiratory effort without tachypnea nor retractions. Breath sounds are clear and equal bilaterally. No wheezes/rales/rhonchi. Gastrointestinal: Soft and nontender. Normal bowel sounds Musculoskeletal: Nontender with normal range of motion in extremities. No lower extremity tenderness nor edema. Neurologic:  Normal speech and language. No gross focal neurologic deficits are appreciated.  Skin:  Skin is warm, dry and intact. No rash noted. Psychiatric: Mood and affect are normal. Speech and behavior are normal.  ____________________________________________  EKG: Interpreted by me.  Sinus rhythm rate 89 bpm, rightward axis, incomplete right bundle branch block, normal QT  ____________________________________________  ED COURSE:  As part of my medical decision making, I reviewed the following data within the electronic MEDICAL RECORD NUMBER History obtained from family if available, nursing notes, old chart and ekg, as well as notes from prior ED visits. Patient presented for chest pain, we will assess  with labs and imaging as indicated at this time.   Procedures ____________________________________________   LABS (pertinent positives/negatives)  Labs Reviewed  CBC - Abnormal; Notable for the following components:      Result Value   WBC 11.8 (*)    All other components within normal limits  FIBRIN DERIVATIVES D-DIMER ([facility] ONLY) - Abnormal; Notable for the following components:   Fibrin derivatives D-dimer ([facility]) 779.68 (*)    All other components within normal limits  BASIC METABOLIC PANEL  TROPONIN I  POC URINE PREG, ED    RADIOLOGY Images were viewed by me  Chest x-ray is unremarkable IMPRESSION: Patchy airspace opacities in the right lower lobe consistent with pneumonia. No parapneumonic effusion. No acute pulmonary embolus. ____________________________________________  DIFFERENTIAL DIAGNOSIS   Muscular skeletal pain, GERD, PE, pneumothorax, pneumonia  FINAL ASSESSMENT AND PLAN  Community-acquired pneumonia, chest pain   Plan: The patient had presented for specific chest pain. Patient's labs are unremarkable. Patient's imaging was initially unremarkable on x-ray but she does have some residual pneumonia on her CT angiogram of the chest.  She was given a dose of Rocephin here and I will change her to Levaquin.  She will be advised to follow-up in 2 days for recheck.   Ulice Dash, MD   Note: This note was generated in part or whole with voice recognition software. Voice recognition is usually quite accurate but there are transcription errors that can and very often do occur. I apologize for any typographical errors that were not detected and corrected.     Emily Filbert, MD 10/29/17 2047

## 2017-10-29 NOTE — ED Triage Notes (Signed)
Pt arrives with complaint of mid sternum sharp chest pain that started this am. Pt states the pain is constant. Pt recently diagnosed with pneumonia and discharged with po antibiotics.

## 2018-08-12 ENCOUNTER — Other Ambulatory Visit: Payer: Self-pay | Admitting: Physician Assistant

## 2018-08-12 DIAGNOSIS — R102 Pelvic and perineal pain: Secondary | ICD-10-CM

## 2018-08-28 ENCOUNTER — Other Ambulatory Visit: Payer: Self-pay

## 2018-08-28 ENCOUNTER — Ambulatory Visit
Admission: RE | Admit: 2018-08-28 | Discharge: 2018-08-28 | Disposition: A | Discharge status: Home / Self Care | Payer: Medicaid Other | Source: Ambulatory Visit | Attending: Physician Assistant | Admitting: Physician Assistant

## 2018-08-28 ENCOUNTER — Encounter (INDEPENDENT_AMBULATORY_CARE_PROVIDER_SITE_OTHER): Payer: Self-pay

## 2018-08-28 DIAGNOSIS — R102 Pelvic and perineal pain: Secondary | ICD-10-CM

## 2018-12-04 ENCOUNTER — Encounter: Payer: Self-pay | Admitting: Emergency Medicine

## 2018-12-04 ENCOUNTER — Emergency Department: Payer: Medicaid Other

## 2018-12-04 ENCOUNTER — Emergency Department
Admission: EM | Admit: 2018-12-04 | Discharge: 2018-12-04 | Disposition: A | Discharge status: Home / Self Care | Payer: Medicaid Other | Attending: Emergency Medicine | Admitting: Emergency Medicine

## 2018-12-04 ENCOUNTER — Other Ambulatory Visit: Payer: Self-pay

## 2018-12-04 DIAGNOSIS — F17210 Nicotine dependence, cigarettes, uncomplicated: Secondary | ICD-10-CM | POA: Insufficient documentation

## 2018-12-04 DIAGNOSIS — N760 Acute vaginitis: Secondary | ICD-10-CM | POA: Insufficient documentation

## 2018-12-04 DIAGNOSIS — R109 Unspecified abdominal pain: Secondary | ICD-10-CM | POA: Diagnosis not present

## 2018-12-04 DIAGNOSIS — B9689 Other specified bacterial agents as the cause of diseases classified elsewhere: Secondary | ICD-10-CM

## 2018-12-04 DIAGNOSIS — N939 Abnormal uterine and vaginal bleeding, unspecified: Secondary | ICD-10-CM | POA: Diagnosis present

## 2018-12-04 LAB — CBC
HCT: 43.4 % (ref 36.0–46.0)
Hemoglobin: 15 g/dL (ref 12.0–15.0)
MCH: 33 pg (ref 26.0–34.0)
MCHC: 34.6 g/dL (ref 30.0–36.0)
MCV: 95.4 fL (ref 80.0–100.0)
Platelets: 235 10*3/uL (ref 150–400)
RBC: 4.55 MIL/uL (ref 3.87–5.11)
RDW: 11.9 % (ref 11.5–15.5)
WBC: 7.9 10*3/uL (ref 4.0–10.5)
nRBC: 0 % (ref 0.0–0.2)

## 2018-12-04 LAB — COMPREHENSIVE METABOLIC PANEL
ALT: 16 U/L (ref 0–44)
AST: 21 U/L (ref 15–41)
Albumin: 4.8 g/dL (ref 3.5–5.0)
Alkaline Phosphatase: 61 U/L (ref 38–126)
Anion gap: 7 (ref 5–15)
BUN: 8 mg/dL (ref 6–20)
CO2: 25 mmol/L (ref 22–32)
Calcium: 9.5 mg/dL (ref 8.9–10.3)
Chloride: 107 mmol/L (ref 98–111)
Creatinine, Ser: 0.81 mg/dL (ref 0.44–1.00)
GFR calc Af Amer: >60 mL/min (ref >60)
GFR calc non Af Amer: >60 mL/min (ref >60)
Glucose, Bld: 117 mg/dL — ABNORMAL HIGH (ref 70–99)
Potassium: 3.6 mmol/L (ref 3.5–5.1)
Sodium: 139 mmol/L (ref 135–145)
Total Bilirubin: 0.9 mg/dL (ref 0.3–1.2)
Total Protein: 7.7 g/dL (ref 6.5–8.1)

## 2018-12-04 LAB — WET PREP, GENITAL
Sperm: NONE SEEN
Trich, Wet Prep: NONE SEEN
Yeast Wet Prep HPF POC: NONE SEEN

## 2018-12-04 LAB — URINALYSIS, COMPLETE (UACMP) WITH MICROSCOPIC
Bacteria, UA: NONE SEEN
RBC / HPF: >50 RBC/hpf — ABNORMAL HIGH (ref 0–5)
Specific Gravity, Urine: 1.01 (ref 1.005–1.030)

## 2018-12-04 LAB — LIPASE, BLOOD: Lipase: 48 U/L (ref 11–51)

## 2018-12-04 LAB — POCT PREGNANCY, URINE: Preg Test, Ur: NEGATIVE

## 2018-12-04 MED ORDER — CLINDAMYCIN PHOSPHATE 100 MG VA SUPP: 100.0000 mg | Freq: Every day | VAGINAL | 0 refills | Status: AC

## 2018-12-04 NOTE — ED Notes (Signed)
IV attempted x2- unsuccessful. 

## 2018-12-04 NOTE — ED Triage Notes (Signed)
Pt from [facility] with c/o lower abd pain and vaginal bleeding. States she is not on her menstrual cycle. PT ambulatory, NAD

## 2018-12-04 NOTE — ED Notes (Signed)
Pt upset that CT scan is taking a long time and would rather leave AMA. EDP made aware.

## 2018-12-04 NOTE — ED Notes (Signed)
Present to First Nurse asking if she is in the correct place.  States she wishes to just go to the Walk In clinic.  Verified with patient's discharge paperwork from [facility] that patient was referred to ED for evaluation of abdominal pain.  Discussed what care had been initiated from Triage assessment.  Patient upset that she was sent to ED.  Reassurance given, not well received.

## 2018-12-04 NOTE — ED Provider Notes (Signed)
[facility] Emergency Department Provider Note  ____________________________________________  Time seen: Approximately 4:46 PM  I have reviewed the triage vital signs and the nursing notes.   HISTORY  Chief Complaint Abdominal Pain    HPI Theresa Fischer is a 23 y.o. female that presents to the emergency department for evaluation of low right and central abdominal pain with vaginal bleeding for 5 days.  Patient states that she has noticed clots.  Patient receives Depo injections and has not had a menstrual cycle in about 3 years.  She does not feel that this is a normal menstrual cycle.  Last Depo injection was 1 month ago.  Patient went to [facility] prior to coming to the emergency department and was referred here.  No fevers, vomiting.  No dysuria.   Past Medical History:  Diagnosis Date  . Bipolar 1 disorder (HCC)   . Depression    Bipolar  . Seasonal allergies     Patient Active Problem List   Diagnosis Date Noted  . Retained products of conception following abortion 10/16/2016  . Seasonal allergies   . Depression 12/10/2011  . Suicidal thoughts 12/10/2011  . Substance abuse (HCC) 12/10/2011    Past Surgical History:  Procedure Laterality Date  . DILATION AND EVACUATION N/A 10/16/2016   Procedure: DILATATION AND EVACUATION;  Surgeon: Conard Novak, MD;  Location: [facility] ORS;  Service: Gynecology;  Laterality: N/A;  . MYRINGOTOMY    . NO PAST SURGERIES      Prior to Admission medications   Medication Sig Start Date End Date Taking? Authorizing Provider  chlorpheniramine-HYDROcodone (TUSSIONEX PENNKINETIC ER) 10-8 MG/5ML SUER Take 5 mLs by mouth 2 (two) times daily. 10/29/17   Emily Filbert, MD  clindamycin (CLEOCIN) 100 MG vaginal suppository Place 1 suppository (100 mg total) vaginally at bedtime for 3 days. 12/04/18 12/07/18  Enid Derry, PA-C  doxycycline (VIBRAMYCIN) 100 MG capsule Take 100 mg by mouth 2 (two) times daily.     [provider]  medroxyPROGESTERone (DEPO-PROVERA) 150 MG/ML injection Inject 1 mL (150 mg total) into the muscle every 3 (three) months. 07/02/16   Tresea Mall, CNM  penciclovir (DENAVIR) 1 % cream Apply 1 application topically every 2 (two) hours. 10/22/17   Joni Reining, PA-C    Allergies Patient has no known allergies.  Family History  Problem Relation Age of Onset  . CAD Mother     Social History Social History   Tobacco Use  . Smoking status: Current Every Day Smoker    Packs/day: 0.10    Types: Cigarettes  . Smokeless tobacco: Never Used  Substance Use Topics  . Alcohol use: Yes    Comment: occ  . Drug use: No    Types: Marijuana    Comment: Last use August 2016     Review of Systems  Constitutional: No fever/chills Cardiovascular: No chest pain. Respiratory: No SOB. Gastrointestinal: Positive for abdominal pain.  No nausea, no vomiting.  Genitourinary: Negative for dysuria. Musculoskeletal: Negative for musculoskeletal pain. Skin: Negative for rash, abrasions, lacerations, ecchymosis. Neurological: Negative for headaches   ____________________________________________   PHYSICAL EXAM:  VITAL SIGNS: ED Triage Vitals  Enc Vitals Group     BP 12/04/18 1403 107/68     Pulse Rate 12/04/18 1403 88     Resp 12/04/18 1403 16     Temp 12/04/18 1403 97.6 F (36.4 C)     Temp Source 12/04/18 1403 Oral     SpO2 12/04/18 1403 97 %  Weight --      Height --      Head Circumference --      Peak Flow --      Pain Score 12/04/18 1400 7     Pain Loc --      Pain Edu? --      Excl. in GC? --      Constitutional: Alert and oriented. Well appearing and in no acute distress. Eyes: Conjunctivae are normal. PERRL. EOMI. Head: Atraumatic. ENT:      Ears:      Nose: No congestion/rhinnorhea.      Mouth/Throat: Mucous membranes are moist.  Neck: No stridor.  Cardiovascular: Normal rate, regular rhythm.  Good peripheral circulation. Respiratory:  Normal respiratory effort without tachypnea or retractions. Lungs CTAB. Good air entry to the bases with no decreased or absent breath sounds. Gastrointestinal: Bowel sounds 4 quadrants.  Right lower quadrant and low mid abdominal tenderness to palpation.  No guarding or rigidity. No palpable masses. No distention.  Pelvic: No external rashes or lesions.  Blood in vaginal canal. Musculoskeletal: Full range of motion to all extremities. No gross deformities appreciated. Neurologic:  Normal speech and language. No gross focal neurologic deficits are appreciated.  Skin:  Skin is warm, dry and intact. No rash noted. Psychiatric: Mood and affect are normal. Speech and behavior are normal. Patient exhibits appropriate insight and judgement.   ____________________________________________   LABS (all labs ordered are listed, but only abnormal results are displayed)  Labs Reviewed  WET PREP, GENITAL - Abnormal; Notable for the following components:      Result Value   Clue Cells Wet Prep HPF POC FEW (*)    WBC, Wet Prep HPF POC FEW (*)    All other components within normal limits  COMPREHENSIVE METABOLIC PANEL - Abnormal; Notable for the following components:   Glucose, Bld 117 (*)    All other components within normal limits  URINALYSIS, COMPLETE (UACMP) WITH MICROSCOPIC - Abnormal; Notable for the following components:   Color, Urine RED (*)    APPearance CLOUDY (*)    Glucose, UA   (*)    Value: TEST NOT REPORTED DUE TO COLOR INTERFERENCE OF URINE PIGMENT   Hgb urine dipstick   (*)    Value: TEST NOT REPORTED DUE TO COLOR INTERFERENCE OF URINE PIGMENT   Bilirubin Urine   (*)    Value: TEST NOT REPORTED DUE TO COLOR INTERFERENCE OF URINE PIGMENT   Ketones, ur   (*)    Value: TEST NOT REPORTED DUE TO COLOR INTERFERENCE OF URINE PIGMENT   Protein, ur   (*)    Value: TEST NOT REPORTED DUE TO COLOR INTERFERENCE OF URINE PIGMENT   Nitrite   (*)    Value: TEST NOT REPORTED DUE TO COLOR  INTERFERENCE OF URINE PIGMENT   Leukocytes,Ua   (*)    Value: TEST NOT REPORTED DUE TO COLOR INTERFERENCE OF URINE PIGMENT   RBC / HPF >50 (*)    All other components within normal limits  GC/CHLAMYDIA PROBE AMP  URINE CULTURE  LIPASE, BLOOD  CBC  POC URINE PREG, ED  POCT PREGNANCY, URINE   ____________________________________________  EKG   ____________________________________________  RADIOLOGY   [place] Pelvic Complete W Transvaginal And Torsion R/o  Result Date: 12/04/2018 CLINICAL DATA:  Bleeding for 1 week EXAM: TRANSABDOMINAL AND TRANSVAGINAL ULTRASOUND OF PELVIS DOPPLER ULTRASOUND OF OVARIES TECHNIQUE: Both transabdominal and transvaginal ultrasound examinations of the pelvis were performed. Transabdominal technique was performed for  global imaging of the pelvis including uterus, ovaries, adnexal regions, and pelvic cul-de-sac. It was necessary to proceed with endovaginal exam following the transabdominal exam to visualize the uterus endometrium ovaries. Color and duplex Doppler ultrasound was utilized to evaluate blood flow to the ovaries. COMPARISON:  08/28/2018 pelvic ultrasound FINDINGS: Uterus Measurements: 7.5 x 3.1 x 4.8 cm = volume: 59 mL. No fibroids or other mass visualized. Endometrium Thickness: 3.7 mm.  No focal abnormality visualized. Right ovary Measurements: 2.7 x 1.8 x 1.9 cm = volume: 5 mL. Normal appearance/no adnexal mass. Left ovary Measurements: 3.1 x 2 x 1.5 cm = volume: 5 mL. Normal appearance/no adnexal mass. Pulsed Doppler evaluation of both ovaries demonstrates normal low-resistance arterial and venous waveforms. Other findings No abnormal free fluid. IMPRESSION: Negative pelvic ultrasound. Negative for ovarian torsion or ovarian mass lesion. Electronically Signed   By: Jasmine Pang M.D.   On: 12/04/2018 17:35    ____________________________________________    PROCEDURES  Procedure(s) performed:    Procedures    Medications - No data to  display   ____________________________________________   INITIAL IMPRESSION / ASSESSMENT AND PLAN / ED COURSE  Pertinent labs & imaging results that were available during my care of the patient were reviewed by me and considered in my medical decision making (see chart for details).  Review of the [place] CSRS was performed in accordance of the [facility] prior to dispensing any controlled drugs.  Patient signed out of the emergency department AGAINST MEDICAL ADVICE.  Patient presents to the emergency department for evaluation of heavy vaginal bleeding and low abdominal pain for 5 days.  Lab work largely unremarkable.  Urinalysis has interference due to probable menstrual blood and will be sent for culture.  Wet prep consistent with bacterial vaginosis.  Patient states that she has just finished a round of oral antibiotics and an unknown topical cream for bacterial vaginosis.  Gonorrhea and Chlamydia are pending.  Ultrasound is normal.  I suspect that this is a breakthrough menstrual cycle but CT scan was ordered to further evaluate low abdominal pain.  CT scan was ordered.  Patient does not wish to wait in the emergency department any longer for CT scan and elects to leave AGAINST MEDICAL ADVICE.  Prescription for vaginal clindamycin suppositories were given.  Patient is to follow up with gynecology for emergency department as directed. Patient is given ED precautions to return to the ED for any worsening or new symptoms.   Theresa Fischer was evaluated in Emergency Department on 12/05/2018 for the symptoms described in the history of present illness. She was evaluated in the context of the global COVID-19 pandemic, which necessitated consideration that the patient might be at risk for infection with the SARS-CoV-2 virus that causes COVID-19. Institutional protocols and algorithms that pertain to the evaluation of patients at risk for COVID-19 are in a state of rapid change based on information released by  regulatory bodies including the CDC and federal and state organizations. These policies and algorithms were followed during the patient's care in the ED.  ____________________________________________  FINAL CLINICAL IMPRESSION(S) / ED DIAGNOSES  Final diagnoses:  Vaginal bleeding  Bacterial vaginosis      NEW MEDICATIONS STARTED DURING THIS VISIT:  ED Discharge Orders         Ordered    clindamycin (CLEOCIN) 100 MG vaginal suppository  Daily at bedtime     12/04/18 1939              This chart was  dictated using voice recognition software/Dragon. Despite best efforts to proofread, errors can occur which can change the meaning. Any change was purely unintentional.    Laban Emperor, PA-C 12/05/18 [id number]    Arta Silence, MD 12/07/18 [phone]

## 2018-12-05 LAB — URINE CULTURE: Culture: <10000 — AB

## 2018-12-07 LAB — GC/CHLAMYDIA PROBE AMP
Chlamydia trachomatis, NAA: NEGATIVE
Neisseria Gonorrhoeae by PCR: NEGATIVE

## 2019-11-23 ENCOUNTER — Emergency Department: Payer: Medicaid Other

## 2019-11-23 ENCOUNTER — Encounter: Payer: Self-pay | Admitting: Emergency Medicine

## 2019-11-23 ENCOUNTER — Emergency Department
Admission: EM | Admit: 2019-11-23 | Discharge: 2019-11-23 | Disposition: A | Discharge status: Home / Self Care | Payer: Medicaid Other | Attending: Emergency Medicine | Admitting: Emergency Medicine

## 2019-11-23 ENCOUNTER — Other Ambulatory Visit: Payer: Self-pay

## 2019-11-23 DIAGNOSIS — R1030 Lower abdominal pain, unspecified: Secondary | ICD-10-CM | POA: Diagnosis not present

## 2019-11-23 DIAGNOSIS — Z3A00 Weeks of gestation of pregnancy not specified: Secondary | ICD-10-CM | POA: Insufficient documentation

## 2019-11-23 DIAGNOSIS — F17210 Nicotine dependence, cigarettes, uncomplicated: Secondary | ICD-10-CM | POA: Insufficient documentation

## 2019-11-23 DIAGNOSIS — O009 Unspecified ectopic pregnancy without intrauterine pregnancy: Secondary | ICD-10-CM | POA: Insufficient documentation

## 2019-11-23 DIAGNOSIS — O3680X0 Pregnancy with inconclusive fetal viability, not applicable or unspecified: Secondary | ICD-10-CM

## 2019-11-23 DIAGNOSIS — R109 Unspecified abdominal pain: Secondary | ICD-10-CM

## 2019-11-23 DIAGNOSIS — O26891 Other specified pregnancy related conditions, first trimester: Secondary | ICD-10-CM | POA: Diagnosis present

## 2019-11-23 DIAGNOSIS — O99330 Smoking (tobacco) complicating pregnancy, unspecified trimester: Secondary | ICD-10-CM | POA: Diagnosis not present

## 2019-11-23 DIAGNOSIS — R110 Nausea: Secondary | ICD-10-CM | POA: Insufficient documentation

## 2019-11-23 LAB — CBC
HCT: 42.5 % (ref 36.0–46.0)
Hemoglobin: 14.3 g/dL (ref 12.0–15.0)
MCH: 33.3 pg (ref 26.0–34.0)
MCHC: 33.6 g/dL (ref 30.0–36.0)
MCV: 99.1 fL (ref 80.0–100.0)
Platelets: 346 10*3/uL (ref 150–400)
RBC: 4.29 MIL/uL (ref 3.87–5.11)
RDW: 11.4 % — ABNORMAL LOW (ref 11.5–15.5)
WBC: 9 10*3/uL (ref 4.0–10.5)
nRBC: 0 % (ref 0.0–0.2)

## 2019-11-23 LAB — WET PREP, GENITAL
Clue Cells Wet Prep HPF POC: NONE SEEN
Sperm: NONE SEEN
Trich, Wet Prep: NONE SEEN
Yeast Wet Prep HPF POC: NONE SEEN

## 2019-11-23 LAB — COMPREHENSIVE METABOLIC PANEL
ALT: 11 U/L (ref 0–44)
AST: 16 U/L (ref 15–41)
Albumin: 4.2 g/dL (ref 3.5–5.0)
Alkaline Phosphatase: 70 U/L (ref 38–126)
Anion gap: 9 (ref 5–15)
BUN: 10 mg/dL (ref 6–20)
CO2: 25 mmol/L (ref 22–32)
Calcium: 9.6 mg/dL (ref 8.9–10.3)
Chloride: 103 mmol/L (ref 98–111)
Creatinine, Ser: 0.78 mg/dL (ref 0.44–1.00)
GFR, Estimated: >60 mL/min (ref >60)
Glucose, Bld: 104 mg/dL — ABNORMAL HIGH (ref 70–99)
Potassium: 4.1 mmol/L (ref 3.5–5.1)
Sodium: 137 mmol/L (ref 135–145)
Total Bilirubin: 0.7 mg/dL (ref 0.3–1.2)
Total Protein: 7.5 g/dL (ref 6.5–8.1)

## 2019-11-23 LAB — URINALYSIS, COMPLETE (UACMP) WITH MICROSCOPIC
Bacteria, UA: NONE SEEN
Bilirubin Urine: NEGATIVE
Glucose, UA: NEGATIVE mg/dL
Hgb urine dipstick: NEGATIVE
Ketones, ur: NEGATIVE mg/dL
Leukocytes,Ua: NEGATIVE
Nitrite: NEGATIVE
Protein, ur: NEGATIVE mg/dL
Specific Gravity, Urine: 1.021 (ref 1.005–1.030)
pH: 7 (ref 5.0–8.0)

## 2019-11-23 LAB — CHLAMYDIA/NGC RT PCR (ARMC ONLY)
Chlamydia Tr: NOT DETECTED
N gonorrhoeae: NOT DETECTED

## 2019-11-23 LAB — HCG, QUANTITATIVE, PREGNANCY: hCG, Beta Chain, Quant, S: 136 m[IU]/mL — ABNORMAL HIGH (ref <5)

## 2019-11-23 LAB — LIPASE, BLOOD: Lipase: 26 U/L (ref 11–51)

## 2019-11-23 MED ORDER — ACETAMINOPHEN 500 MG PO TABS
1000.0000 mg | ORAL_TABLET | Freq: Once | ORAL | Status: AC
  Administered 2019-11-23: 1000 mg via ORAL
  Filled 2019-11-23: qty 2

## 2019-11-23 NOTE — ED Triage Notes (Signed)
Pt via pov from home with lower abdominal pain, more severe on right side. Pt just found out she was pregnant yesterday, states she has not had a period in 7 months. Pt states she was tested for STI yesterday, it was negative, and she was told to come to ED if the pain persisted or got worse. She states it is worse today. Endorses frequency, denies dysuria. NAD noted.

## 2019-11-23 NOTE — ED Provider Notes (Signed)
[facility] Emergency Department Provider Note   ____________________________________________   First MD Initiated Contact with Patient 11/23/19 1245     (approximate)  I have reviewed the triage vital signs and the nursing notes.   HISTORY  Chief Complaint Abdominal Pain    HPI Theresa Fischer is a 24 y.o. female, [id number] with past medical history of bipolar disorder and polysubstance abuse who presents to the ED complain of abdominal pain.  Patient reports she has been dealing with constant sharp pain in her pelvic area for the past 3 to 4 days.  It is associated with nausea, but she denies any vomiting, changes in her bowel movements, dysuria, or hematuria.  She denies any vaginal bleeding, but has been dealing with some clear watery discharge for the past couple of days.  She was initially evaluated at an urgent care yesterday, where she found out she was pregnant and was told to present to the ED if she continued to have pain.  She is unsure of her LMP as she has had irregular periods ever since receiving Depo shot last year.  No exacerbating or alleviating factors for her pain.        Past Medical History:  Diagnosis Date  . Bipolar 1 disorder (HCC)   . Depression    Bipolar  . Seasonal allergies     Patient Active Problem List   Diagnosis Date Noted  . Retained products of conception following abortion 10/16/2016  . Seasonal allergies   . Depression 12/10/2011  . Suicidal thoughts 12/10/2011  . Substance abuse (HCC) 12/10/2011    Past Surgical History:  Procedure Laterality Date  . DILATION AND EVACUATION N/A 10/16/2016   Procedure: DILATATION AND EVACUATION;  Surgeon: Conard Novak, MD;  Location: [facility] ORS;  Service: Gynecology;  Laterality: N/A;  . MYRINGOTOMY    . NO PAST SURGERIES      Prior to Admission medications   Medication Sig Start Date End Date Taking? Authorizing Provider  chlorpheniramine-HYDROcodone (TUSSIONEX  PENNKINETIC ER) 10-8 MG/5ML SUER Take 5 mLs by mouth 2 (two) times daily. 10/29/17   Emily Filbert, MD  doxycycline (VIBRAMYCIN) 100 MG capsule Take 100 mg by mouth 2 (two) times daily.    [provider]  medroxyPROGESTERone (DEPO-PROVERA) 150 MG/ML injection Inject 1 mL (150 mg total) into the muscle every 3 (three) months. 07/02/16   Tresea Mall, CNM  penciclovir (DENAVIR) 1 % cream Apply 1 application topically every 2 (two) hours. 10/22/17   Joni Reining, PA-C    Allergies Patient has no known allergies.  Family History  Problem Relation Age of Onset  . CAD Mother     Social History Social History   Tobacco Use  . Smoking status: Current Every Day Smoker    Packs/day: 0.20    Types: Cigarettes  . Smokeless tobacco: Never Used  Vaping Use  . Vaping Use: Never used  Substance Use Topics  . Alcohol use: Yes    Comment: occ  . Drug use: No    Types: Marijuana    Comment: Last use August 2016    Review of Systems  Constitutional: No fever/chills Eyes: No visual changes. ENT: No sore throat. Cardiovascular: Denies chest pain. Respiratory: Denies shortness of breath. Gastrointestinal: Positive for abdominal pain.  Positive for nausea, no vomiting.  No diarrhea.  No constipation. Genitourinary: Negative for dysuria.  Positive for vaginal discharge, negative for vaginal bleeding. Musculoskeletal: Negative for back pain. Skin: Negative for rash.  Neurological: Negative for headaches, focal weakness or numbness.  ____________________________________________   PHYSICAL EXAM:  VITAL SIGNS: ED Triage Vitals  Enc Vitals Group     BP 11/23/19 1224 119/75     Pulse Rate 11/23/19 1224 72     Resp 11/23/19 1224 18     Temp 11/23/19 1224 97.7 F (36.5 C)     Temp Source 11/23/19 1224 Oral     SpO2 11/23/19 1224 100 %     Weight 11/23/19 1225 122 lb (55.3 kg)     Height 11/23/19 1225 5\' 2"  (1.575 m)     Head Circumference --      Peak Flow --       Pain Score 11/23/19 1231 7     Pain Loc --      Pain Edu? --      Excl. in GC? --     Constitutional: Alert and oriented. Eyes: Conjunctivae are normal. Head: Atraumatic. Nose: No congestion/rhinnorhea. Mouth/Throat: Mucous membranes are moist. Neck: Normal ROM Cardiovascular: Normal rate, regular rhythm. Grossly normal heart sounds. Respiratory: Normal respiratory effort.  No retractions. Lungs CTAB. Gastrointestinal: Soft and tender to palpation in suprapubic area with no rebound or guarding. No distention. Genitourinary: Thin whitish discharge noted, no cervical motion adnexal tenderness. Musculoskeletal: No lower extremity tenderness nor edema. Neurologic:  Normal speech and language. No gross focal neurologic deficits are appreciated. Skin:  Skin is warm, dry and intact. No rash noted. Psychiatric: Mood and affect are normal. Speech and behavior are normal.  ____________________________________________   LABS (all labs ordered are listed, but only abnormal results are displayed)  Labs Reviewed  WET PREP, GENITAL - Abnormal; Notable for the following components:      Result Value   WBC, Wet Prep HPF POC FEW (*)    All other components within normal limits  COMPREHENSIVE METABOLIC PANEL - Abnormal; Notable for the following components:   Glucose, Bld 104 (*)    All other components within normal limits  CBC - Abnormal; Notable for the following components:   RDW 11.4 (*)    All other components within normal limits  URINALYSIS, COMPLETE (UACMP) WITH MICROSCOPIC - Abnormal; Notable for the following components:   Color, Urine YELLOW (*)    APPearance CLEAR (*)    All other components within normal limits  HCG, QUANTITATIVE, PREGNANCY - Abnormal; Notable for the following components:   hCG, Beta Chain, Quant, S 136 (*)    All other components within normal limits  CHLAMYDIA/NGC RT PCR ([facility] ONLY)  LIPASE, BLOOD  POC URINE PREG, ED     PROCEDURES  Procedure(s) performed (including Critical Care):  Procedures   ____________________________________________   INITIAL IMPRESSION / ASSESSMENT AND PLAN / ED COURSE       24 year old female with history of bipolar disorder and polysubstance abuse, G3 P2-0-0-2 at unknown age of pregnancy presents to the ED with suprapubic abdominal pain constantly for the past 3 to 4 days.  Pain is reproducible with palpation of her suprapubic area, labs and UA are pending at this time, we will also confirm positive pregnancy test.  Plan to further assess with pelvic exam and ultrasound to rule out ectopic pregnancy.  Differential also includes cervicitis, PID, cystitis and round ligament pain.  We will treat patient's pain with Tylenol.  Patient noted to have thin whitish discharge on pelvic exam but no cervical motion adnexal tenderness, wet prep and STI testing are negative and I doubt PID.  Patient's hormone levels noted  to be quite low at less than 200 and ultrasound shows pregnancy of unknown location.  UA without signs of UTI.  Her pain is improved following Tylenol and case discussed with Dr. Valentino Saxon of OB/GYN, who recommends follow-up in the ED in 2 days for repeat beta hCG along with ultrasound if this is greater than 1500.  Patient otherwise may schedule follow-up with OB/GYN for next week.  Patient counseled to return to the ED for new or worsening symptoms, patient agrees with plan.      ____________________________________________   FINAL CLINICAL IMPRESSION(S) / ED DIAGNOSES  Final diagnoses:  Pregnancy of unknown anatomic location  Abdominal pain during pregnancy in first trimester     ED Discharge Orders    None       Note:  This document was prepared using Dragon voice recognition software and may include unintentional dictation errors.   Chesley Noon, MD 11/23/19 [phone]

## 2019-11-25 ENCOUNTER — Encounter: Payer: Self-pay | Admitting: Emergency Medicine

## 2019-11-25 ENCOUNTER — Emergency Department
Admission: EM | Admit: 2019-11-25 | Discharge: 2019-11-25 | Disposition: A | Discharge status: Home / Self Care | Payer: Medicaid Other | Attending: Emergency Medicine | Admitting: Emergency Medicine

## 2019-11-25 ENCOUNTER — Other Ambulatory Visit: Payer: Self-pay

## 2019-11-25 DIAGNOSIS — O3680X0 Pregnancy with inconclusive fetal viability, not applicable or unspecified: Secondary | ICD-10-CM | POA: Diagnosis not present

## 2019-11-25 DIAGNOSIS — F17210 Nicotine dependence, cigarettes, uncomplicated: Secondary | ICD-10-CM | POA: Diagnosis not present

## 2019-11-25 DIAGNOSIS — R109 Unspecified abdominal pain: Secondary | ICD-10-CM | POA: Diagnosis present

## 2019-11-25 LAB — HCG, QUANTITATIVE, PREGNANCY: hCG, Beta Chain, Quant, S: 335 m[IU]/mL — ABNORMAL HIGH (ref <5)

## 2019-11-25 NOTE — ED Notes (Signed)
Pt states she is here to get HcG levels recheck as advised by a ED MD that she saw a few days ago. Pt some ABD pain but no vaginal bleeding. Pt states unknown first day of last period due to being "irregular". NAD.

## 2019-11-25 NOTE — ED Provider Notes (Signed)
[facility] Emergency Department Provider Note  ____________________________________________  Time seen: Approximately 4:05 PM  I have reviewed the triage vital signs and the nursing notes.   HISTORY  Chief Complaint Threatened Miscarriage    HPI Theresa Fischer is a 24 y.o. female who presents the emergency department for recheck of her hCG.  Patient had presented to this emergency department 2 days ago with complaint of abdominal pain, also vaginal discharge.  At that time it was noted that patient had a positive hCG at 176.  Ultrasound at that time was unable to identify pregnancy.  OB/GYN recommended repeat hCG today.  If hCG was trending up, likely pregnancy.  If hCG was about 1500 they recommended repeat ultrasound.  If hCG was trending up with did not meet the 1500 mark, they recommended follow-up at the first part of the week with OB.  Patient has no other complaints currently.          Past Medical History:  Diagnosis Date  . Bipolar 1 disorder (HCC)   . Depression    Bipolar  . Seasonal allergies     Patient Active Problem List   Diagnosis Date Noted  . Retained products of conception following abortion 10/16/2016  . Seasonal allergies   . Depression 12/10/2011  . Suicidal thoughts 12/10/2011  . Substance abuse (HCC) 12/10/2011    Past Surgical History:  Procedure Laterality Date  . DILATION AND EVACUATION N/A 10/16/2016   Procedure: DILATATION AND EVACUATION;  Surgeon: Conard Novak, MD;  Location: [facility] ORS;  Service: Gynecology;  Laterality: N/A;  . MYRINGOTOMY    . NO PAST SURGERIES      Prior to Admission medications   Medication Sig Start Date End Date Taking? Authorizing Provider  chlorpheniramine-HYDROcodone (TUSSIONEX PENNKINETIC ER) 10-8 MG/5ML SUER Take 5 mLs by mouth 2 (two) times daily. 10/29/17   Emily Filbert, MD  doxycycline (VIBRAMYCIN) 100 MG capsule Take 100 mg by mouth 2 (two) times daily.    [provider]  medroxyPROGESTERone (DEPO-PROVERA) 150 MG/ML injection Inject 1 mL (150 mg total) into the muscle every 3 (three) months. 07/02/16   Tresea Mall, CNM  penciclovir (DENAVIR) 1 % cream Apply 1 application topically every 2 (two) hours. 10/22/17   Joni Reining, PA-C    Allergies Patient has no known allergies.  Family History  Problem Relation Age of Onset  . CAD Mother     Social History Social History   Tobacco Use  . Smoking status: Current Every Day Smoker    Packs/day: 0.20    Types: Cigarettes  . Smokeless tobacco: Never Used  Vaping Use  . Vaping Use: Never used  Substance Use Topics  . Alcohol use: Yes    Comment: occ  . Drug use: No    Types: Marijuana    Comment: Last use August 2016     Review of Systems  Constitutional: No fever/chills Eyes: No visual changes. No discharge ENT: No upper respiratory complaints. Cardiovascular: no chest pain. Respiratory: no cough. No SOB. Gastrointestinal: No abdominal pain.  No nausea, no vomiting.  No diarrhea.  No constipation. Genitourinary: Negative for dysuria. No hematuria.  Clear vaginal discharge Musculoskeletal: Negative for musculoskeletal pain. Skin: Negative for rash, abrasions, lacerations, ecchymosis. Neurological: Negative for headaches, focal weakness or numbness.  10 System ROS otherwise negative.  ____________________________________________   PHYSICAL EXAM:  VITAL SIGNS: ED Triage Vitals  Enc Vitals Group     BP 11/25/19 1530 90/62  Pulse Rate 11/25/19 1530 88     Resp 11/25/19 1530 17     Temp 11/25/19 1530 97.9 F (36.6 C)     Temp Source 11/25/19 1530 Oral     SpO2 11/25/19 1530 98 %     Weight 11/25/19 1526 122 lb (55.3 kg)     Height 11/25/19 1526 5\' 2"  (1.575 m)     Head Circumference --      Peak Flow --      Pain Score 11/25/19 1526 1     Pain Loc --      Pain Edu? --      Excl. in GC? --      Constitutional: Alert and oriented. Well appearing and in no  acute distress. Eyes: Conjunctivae are normal. PERRL. EOMI. Head: Atraumatic. ENT:      Ears:       Nose: No congestion/rhinnorhea.      Mouth/Throat: Mucous membranes are moist.  Neck: No stridor.    Cardiovascular: Normal rate, regular rhythm. Normal S1 and S2.  Good peripheral circulation. Respiratory: Normal respiratory effort without tachypnea or retractions. Lungs CTAB. Good air entry to the bases with no decreased or absent breath sounds. Gastrointestinal: Bowel sounds 4 quadrants. Soft and nontender to palpation. No guarding or rigidity. No palpable masses. No distention. No CVA tenderness. Musculoskeletal: Full range of motion to all extremities. No gross deformities appreciated. Neurologic:  Normal speech and language. No gross focal neurologic deficits are appreciated.  Skin:  Skin is warm, dry and intact. No rash noted. Psychiatric: Mood and affect are normal. Speech and behavior are normal. Patient exhibits appropriate insight and judgement.   ____________________________________________   LABS (all labs ordered are listed, but only abnormal results are displayed)  Labs Reviewed  HCG, QUANTITATIVE, PREGNANCY - Abnormal; Notable for the following components:      Result Value   hCG, Beta Chain, Quant, S 335 (*)    All other components within normal limits   ____________________________________________  EKG   ____________________________________________  RADIOLOGY   No results found.  ____________________________________________    PROCEDURES  Procedure(s) performed:    Procedures    Medications - No data to display   ____________________________________________   INITIAL IMPRESSION / ASSESSMENT AND PLAN / ED COURSE  Pertinent labs & imaging results that were available during my care of the patient were reviewed by me and considered in my medical decision making (see chart for details).  Review of the [place] CSRS was performed in accordance of  the [facility] prior to dispensing any controlled drugs.           Patient's diagnosis is consistent with pregnancy of unknown anatomic location.  Patient presented to the emergency department for hCG trending.  Patient had been seen 2 days ago for abdominal pain, vaginal discharge and noted to have a positive hCG.  Ultrasound time was unable to evaluate identified pregnancy.  OB/GYN's recommendations for repeat hCG today.  If hCG was trending upwards but did not meet 1500, patient did not need a repeat ultrasound and could follow-up with OB/GYN at the start of next week.  Patient's hCG was above 15 and they recommended repeat ultrasound.  hCG is trending upwards, patient currently has an hCG of 335 up from 136.11/27/19  At this time patient is stable.  Patient will follow up with OB/GYN.  Return precautions discussed at length with the patient.  Return for any sudden worsening of pain, heavy vaginal bleeding, fevers or chills or other  complaints.  Patient is given ED precautions to return to the ED for any worsening or new symptoms.     ____________________________________________  FINAL CLINICAL IMPRESSION(S) / ED DIAGNOSES  Final diagnoses:  Pregnancy of unknown anatomic location      NEW MEDICATIONS STARTED DURING THIS VISIT:  ED Discharge Orders    None          This chart was dictated using voice recognition software/Dragon. Despite best efforts to proofread, errors can occur which can change the meaning. Any change was purely unintentional.    Racheal Patches, PA-C 11/25/19 1936    Arnaldo Natal, MD 11/25/19 [phone]

## 2019-11-25 NOTE — ED Triage Notes (Signed)
Pt comes into the ED via POV c/o need for recheck on Hcg levels.  Pt was seen on Sunday where she was told she was pregnant.  She then came to the ED Monday because of abdominal pain where they checked her initial HCG levels.  Pt was told if she continues to have pain and since her original HCG levels were low and she was too early for [place] to show viable baby, she was instructed to come back for another recheck of HCG levels.  Pt in NAD at this time. Pt still has some abdominal pain but states it is manageable.

## 2019-12-04 ENCOUNTER — Other Ambulatory Visit (HOSPITAL_COMMUNITY)
Admission: RE | Admit: 2019-12-04 | Discharge: 2019-12-04 | Disposition: A | Discharge status: Home / Self Care | Payer: Medicaid Other | Source: Ambulatory Visit | Attending: Obstetrics and Gynecology | Admitting: Obstetrics and Gynecology

## 2019-12-04 ENCOUNTER — Encounter: Payer: Self-pay | Admitting: Obstetrics and Gynecology

## 2019-12-04 ENCOUNTER — Ambulatory Visit (INDEPENDENT_AMBULATORY_CARE_PROVIDER_SITE_OTHER): Payer: Medicaid Other | Admitting: Obstetrics and Gynecology

## 2019-12-04 ENCOUNTER — Other Ambulatory Visit: Payer: Self-pay

## 2019-12-04 VITALS — BP 110/58 | Ht 62.0 in | Wt 120.0 lb

## 2019-12-04 DIAGNOSIS — Z124 Encounter for screening for malignant neoplasm of cervix: Secondary | ICD-10-CM | POA: Insufficient documentation

## 2019-12-04 DIAGNOSIS — Z113 Encounter for screening for infections with a predominantly sexual mode of transmission: Secondary | ICD-10-CM | POA: Insufficient documentation

## 2019-12-04 NOTE — Progress Notes (Signed)
Obstetric Problem Visit    Chief Complaint:  Chief Complaint  Patient presents with  . Follow-up    ER 11/24/20. RM 4    History of Present Illness: Patient is a 24 y.o. [id number] Unknown presenting for first trimester bleeding.  The onset of bleeding was self limited one week ago.  Is bleeding equal to or greater than normal menstrual flow:  No Any recent trauma:  No Recent intercourse:  Yes History of prior miscarriage:  Yes Prior ultrasound demonstrating IUP: none.  Prior ultrasound demonstrating viable IUP:  No Prior Serum HCG:  Yes 145mIU/mL on 11/23/2019 with rise to 361mIU/mL on 11/25/2019 Rh status: A NEG   Review of Systems: Review of Systems  Constitutional: Negative.   Gastrointestinal: Negative.   Genitourinary: Negative.     Past Medical History:  Patient Active Problem List   Diagnosis Date Noted  . Retained products of conception following abortion 10/16/2016  . Seasonal allergies   . Depression 12/10/2011  . Suicidal thoughts 12/10/2011  . Substance abuse (HCC) 12/10/2011    Past Surgical History:  Past Surgical History:  Procedure Laterality Date  . DILATION AND EVACUATION N/A 10/16/2016   Procedure: DILATATION AND EVACUATION;  Surgeon: Conard Novak, MD;  Location: [facility] ORS;  Service: Gynecology;  Laterality: N/A;  . MYRINGOTOMY    . NO PAST SURGERIES      Obstetric History: [id number]  Family History:  Family History  Problem Relation Age of Onset  . CAD Mother     Social History:  Social History   Socioeconomic History  . Marital status: Single    Spouse name: Not on file  . Number of children: Not on file  . Years of education: Not on file  . Highest education level: Not on file  Occupational History  . Not on file  Tobacco Use  . Smoking status: Current Every Day Smoker    Packs/day: 0.20    Types: Cigarettes  . Smokeless tobacco: Never Used  Vaping Use  . Vaping Use: Never used  Substance and Sexual Activity  . Alcohol  use: Not Currently    Comment: occ  . Drug use: No    Types: Marijuana    Comment: Last use August 2016  . Sexual activity: Yes  Other Topics Concern  . Not on file  Social History Narrative  . Not on file   Social Determinants of Health   Financial Resource Strain:   . Difficulty of Paying Living Expenses: Not on file  Food Insecurity:   . Worried About Programme researcher, broadcasting/film/video in the Last Year: Not on file  . Ran Out of Food in the Last Year: Not on file  Transportation Needs:   . Lack of Transportation (Medical): Not on file  . Lack of Transportation (Non-Medical): Not on file  Physical Activity:   . Days of Exercise per Week: Not on file  . Minutes of Exercise per Session: Not on file  Stress:   . Feeling of Stress : Not on file  Social Connections:   . Frequency of Communication with Friends and Family: Not on file  . Frequency of Social Gatherings with Friends and Family: Not on file  . Attends Religious Services: Not on file  . Active Member of Clubs or Organizations: Not on file  . Attends Banker Meetings: Not on file  . Marital Status: Not on file  Intimate Partner Violence:   . Fear of Current or Ex-Partner: Not on  file  . Emotionally Abused: Not on file  . Physically Abused: Not on file  . Sexually Abused: Not on file    Allergies:  No Known Allergies  Medications: Prior to Admission medications   Medication Sig Start Date End Date Taking? Authorizing Provider  chlorpheniramine-HYDROcodone (TUSSIONEX PENNKINETIC ER) 10-8 MG/5ML SUER Take 5 mLs by mouth 2 (two) times daily. Patient not taking: Reported on 12/04/2019 10/29/17   Emily Filbert, MD  doxycycline (VIBRAMYCIN) 100 MG capsule Take 100 mg by mouth 2 (two) times daily. Patient not taking: Reported on 12/04/2019    [provider]  medroxyPROGESTERone (DEPO-PROVERA) 150 MG/ML injection Inject 1 mL (150 mg total) into the muscle every 3 (three) months. Patient not taking:  Reported on 12/04/2019 07/02/16   Tresea Mall, CNM  penciclovir [facility]) 1 % cream Apply 1 application topically every 2 (two) hours. Patient not taking: Reported on 12/04/2019 10/22/17   Joni Reining, PA-C    Physical Exam Vitals: Blood pressure (!) 110/58, height 5\' 2"  (1.575 m), weight 120 lb (54.4 kg).  General: NAD HEENT: normocephalic, anicteric Neck: Thyroid non-enlarged, no nodules Pulmonary: No increased work of breathing, Abdomen: NABS, soft, non-tender, non-distended.  Umbilicus without lesions.  No hepatomegaly, splenomegaly or masses palpable. No evidence of hernia  Genitourinary:  External: Normal external female genitalia.  Normal urethral meatus, normal Bartholin's and Skene's glands.    Vagina: Normal vaginal mucosa, no evidence of prolapse.    Cervix: closed  Uterus:non-enlarged  Adnexa: ovaries non-enlarged, no adnexal masses  Rectal: deferred Extremities: no edema, erythema, or tenderness Neurologic: Grossly intact Psychiatric: mood appropriate, affect full  TVUS: early IUP seen.  Location is fundal,GS and yolk sac visualized, suggestion of early fetal pole but not clearly visualized yet, no FHT.  Assessment: 24 y.o. 30 Unknown presenting for evaluation of first trimester vaginal bleeding following intercourse 1 week ago  Plan: Problem List Items Addressed This Visit    None    Visit Diagnoses    Screening for malignant neoplasm of cervix    -  Primary   Relevant Orders   Cytology - PAP   Routine screening for STI (sexually transmitted infection)       Relevant Orders   Cytology - PAP      1) First trimester bleeding - incidence and clinical course of first trimester bleeding is discussed in detail with the patient today.  Approximately 1/3 of pregnancies ending in live births experienced 1st trimester bleeding.  The amount of bleeding is variable and not necessarily predictive of outcome.  Sources may be cervical or uterine.  Subchorionic  hemorrhages are a frequent concurrent findings on ultrasound and are followed expectantly.  These often absorb or regress spontaneously although risk for expansion and further disruption of the utero-placental interface leading to miscarriage is possible.  There is no clearly documented benefit to limiting or modifying activity and sexual intercourse in altering clinic course of 1st trimester bleeding.    2) Follow up imaging in 11 days "Society of Radiologyst in Ultrasound Guidelines for Transvaginal Ultrasonographic Diagnosis of Early Pregnancy Loss" and adopted in ACOG Practice Bulletin Number 150, May 2015 (reaffirmed 2017) "Early Pregnancy Loss"  3) Routine bleeding precautions were discussed with the patient prior the conclusion of today's visit.    2018, MD, [facility], [facility] 12/04/2019, 4:49 PM

## 2019-12-09 LAB — CYTOLOGY - PAP
Chlamydia: NEGATIVE
Comment: NEGATIVE
Comment: NORMAL
Neisseria Gonorrhea: NEGATIVE

## 2019-12-17 ENCOUNTER — Other Ambulatory Visit: Payer: Self-pay | Admitting: Obstetrics & Gynecology

## 2019-12-17 ENCOUNTER — Encounter: Payer: Self-pay | Admitting: Obstetrics and Gynecology

## 2019-12-17 ENCOUNTER — Other Ambulatory Visit: Payer: Self-pay

## 2019-12-17 ENCOUNTER — Ambulatory Visit (INDEPENDENT_AMBULATORY_CARE_PROVIDER_SITE_OTHER): Payer: Medicaid Other

## 2019-12-17 ENCOUNTER — Ambulatory Visit (INDEPENDENT_AMBULATORY_CARE_PROVIDER_SITE_OTHER): Payer: Medicaid Other | Admitting: Obstetrics and Gynecology

## 2019-12-17 VITALS — BP 90/60 | Wt 120.0 lb

## 2019-12-17 DIAGNOSIS — Z3491 Encounter for supervision of normal pregnancy, unspecified, first trimester: Secondary | ICD-10-CM

## 2019-12-17 DIAGNOSIS — Z3480 Encounter for supervision of other normal pregnancy, unspecified trimester: Secondary | ICD-10-CM | POA: Insufficient documentation

## 2019-12-17 DIAGNOSIS — Z113 Encounter for screening for infections with a predominantly sexual mode of transmission: Secondary | ICD-10-CM

## 2019-12-17 DIAGNOSIS — Z3481 Encounter for supervision of other normal pregnancy, first trimester: Secondary | ICD-10-CM

## 2019-12-17 DIAGNOSIS — Z369 Encounter for antenatal screening, unspecified: Secondary | ICD-10-CM

## 2019-12-17 DIAGNOSIS — Z6791 Unspecified blood type, Rh negative: Secondary | ICD-10-CM

## 2019-12-17 DIAGNOSIS — O26891 Other specified pregnancy related conditions, first trimester: Secondary | ICD-10-CM | POA: Insufficient documentation

## 2019-12-17 DIAGNOSIS — Z3A01 Less than 8 weeks gestation of pregnancy: Secondary | ICD-10-CM | POA: Diagnosis not present

## 2019-12-17 DIAGNOSIS — Z7185 Encounter for immunization safety counseling: Secondary | ICD-10-CM

## 2019-12-17 LAB — OB RESULTS CONSOLE VARICELLA ZOSTER ANTIBODY, IGG: Varicella: IMMUNE

## 2019-12-17 NOTE — Progress Notes (Signed)
New Obstetric Patient H&P    Chief Complaint: Positive home pregnancy test   History of Present Illness: Patient is a 24 y.o. [id number] Not Hispanic or Latino female, presents with amenorrhea and positive home pregnancy test. Patient's last menstrual period was 11/06/2019. and based on her  LMP, her EDD is Estimated Date of Delivery: 08/03/20 and her EGA is [redacted]w[redacted]d. Cycles are scant and irregular - patient has been on depo for contraception. Her last pap smear was on 12/04/19 - [facility].   She had a urine pregnancy test which was positive 3 or 4 week(s)  ago. Her last menstrual period was irregular, which was normal for the patient while receiving depo injections. She is currently experiencing nausea and intermittent abdominal cramping. She denies vaginal bleeding. Her past medical history is contibutory. Her prior pregnancies are notable for G1 - term NSVD, G2- term NSVD - uncomplicated pregnancy courses.  Since her LMP, she admits to the use of tobacco products  Yes - patient reports cutting back on vaping. She claims she has lost 3 lb since the start of her pregnancy.  There are cats in the home in the home  yes If yes Indoor - educated on litterbox changing in pregnancy  She admits close contact with children on a regular basis  yes  She has had chicken pox in the past yes She has had Tuberculosis exposures, symptoms, or previously tested positive for TB   no Current or past history of domestic violence. no  Genetic Screening/Teratology Counseling: (Includes patient, baby's father, or anyone in either family with:)   1. Patient's age >/= 45 at [facility]  no 2. Thalassemia ([place], [place], Mediterranean, or Asian background): MCV<80  no 3. Neural tube defect (meningomyelocele, spina bifida, anencephaly)  no 4. Congenital heart defect  no  5. Down syndrome  no 6. Tay-Sachs (Jewish, [place]  no 7. Canavan's Disease  no 8. Sickle cell disease or trait (African)  no  9. Hemophilia or other  blood disorders  no  10. Muscular dystrophy  no  11. Cystic fibrosis  no  12. Huntington's Chorea  no  13. Mental retardation/autism  no 14. Other inherited genetic or chromosomal disorder  no 15. Maternal metabolic disorder (DM, PKU, etc)  no 16. Patient or FOB with a child with a birth defect not listed above no  16a. Patient or FOB with a birth defect themselves no 17. Recurrent pregnancy loss, or stillbirth  no  18. Any medications since LMP other than prenatal vitamins (include vitamins, supplements, OTC meds, drugs, alcohol)  Yes - THC 19. Any other genetic/environmental exposure to discuss  no  Infection History:   1. Lives with someone with TB or TB exposed  no  2. Patient or partner has history of genital herpes  no 3. Rash or viral illness since LMP  no 4. History of STI (GC, CT, HPV, syphilis, HIV)  no 5. History of recent travel :  no  Other pertinent information:  Currently working as Production assistant, radio - lives with significant other and children.     Review of Systems:10 point review of systems negative unless otherwise noted in HPI  Past Medical History:  Patient Active Problem List   Diagnosis Date Noted  . Encounter for supervision of other normal pregnancy, first trimester 12/17/2019    Clinic [facility] Prenatal Labs  Dating  7w [place] Blood type:     Genetic Screen  NIPS: Desires Antibody:   Anatomic [place]  Rubella:   Varicella: @  VZVIGG@  GTT Early: n/a              Third trimester:  RPR:     Rhogam  HBsAg:     TDaP vaccine                       Flu Shot:  HIV:     Baby Food                                GBS:   Contraception  Pap:  CBB     CS/VBAC    Support Person         . Rh negative state in antepartum period, first trimester 12/17/2019  . Retained products of conception following abortion 10/16/2016  . Seasonal allergies   . Depression 12/10/2011  . Suicidal thoughts 12/10/2011  . Substance abuse (HCC) 12/10/2011    Past Surgical History:  Past Surgical  History:  Procedure Laterality Date  . DILATION AND EVACUATION N/A 10/16/2016   Procedure: DILATATION AND EVACUATION;  Surgeon: Conard Novak, MD;  Location: [facility] ORS;  Service: Gynecology;  Laterality: N/A;  . MYRINGOTOMY    . NO PAST SURGERIES      Gynecologic History: Patient's last menstrual period was 11/06/2019.  Obstetric History: [id number]  Family History:  Family History  Problem Relation Age of Onset  . CAD Mother     Social History:  Social History   Socioeconomic History  . Marital status: Single    Spouse name: Not on file  . Number of children: Not on file  . Years of education: Not on file  . Highest education level: Not on file  Occupational History  . Not on file  Tobacco Use  . Smoking status: Current Every Day Smoker    Packs/day: 0.20    Types: Cigarettes  . Smokeless tobacco: Never Used  Vaping Use  . Vaping Use: Never used  Substance and Sexual Activity  . Alcohol use: Not Currently    Comment: occ  . Drug use: No    Types: Marijuana    Comment: Last use August 2016  . Sexual activity: Yes  Other Topics Concern  . Not on file  Social History Narrative  . Not on file   Social Determinants of Health   Financial Resource Strain: Not on file  Food Insecurity: Not on file  Transportation Needs: Not on file  Physical Activity: Not on file  Stress: Not on file  Social Connections: Not on file  Intimate Partner Violence: Not on file    Allergies:  No Known Allergies  Medications: Prior to Admission medications   Medication Sig Start Date End Date Taking? Authorizing Provider  Prenatal Vit-Fe Fumarate-FA (MULTIVITAMIN-PRENATAL) 27-0.8 MG TABS tablet Take 1 tablet by mouth daily at 12 noon.   Yes [provider]  chlorpheniramine-HYDROcodone (TUSSIONEX PENNKINETIC ER) 10-8 MG/5ML SUER Take 5 mLs by mouth 2 (two) times daily. Patient not taking: No sig reported 10/29/17   Emily Filbert, MD  doxycycline (VIBRAMYCIN) 100  MG capsule Take 100 mg by mouth 2 (two) times daily. Patient not taking: No sig reported    [provider]  medroxyPROGESTERone (DEPO-PROVERA) 150 MG/ML injection Inject 1 mL (150 mg total) into the muscle every 3 (three) months. Patient not taking: No sig reported 07/02/16   Tresea Mall, CNM  penciclovir (DENAVIR) 1 % cream Apply 1 application topically every  2 (two) hours. Patient not taking: No sig reported 10/22/17   Joni Reining, PA-C    Physical Exam Vitals: Blood pressure 90/60, weight 120 lb (54.4 kg), last menstrual period 11/06/2019.  General: NAD HEENT: normocephalic, anicteric Thyroid: no enlargement, no palpable nodules Pulmonary: No increased work of breathing, CTAB Cardiovascular: RRR, distal pulses 2+ Abdomen: NABS, soft, non-tender, non-distended.  Umbilicus without lesions.  No hepatomegaly, splenomegaly or masses palpable. No evidence of hernia  Extremities: no edema, erythema, or tenderness Neurologic: Grossly intact Psychiatric: mood appropriate, affect full Pelvic exam deferred - completed on 12/04/19  Assessment: 24 y.o. [id number] at [redacted]w[redacted]d presenting to initiate prenatal care  Plan: 1) Avoid alcoholic beverages. 2) Patient encouraged not to smoke. Plans to continue to cut back on daily vaping. 3) Discontinue the use of all non-medicinal drugs and chemicals.  4) Take prenatal vitamins daily.  5) Nutrition, food safety (fish, cheese advisories, and high nitrite foods) and exercise discussed. 6) Hospital and practice style discussed with cross coverage system.  7) Genetic Screening, such as with 1st Trimester Screening, cell free fetal DNA, AFP testing, and Ultrasound, as well as with amniocentesis and CVS as appropriate, is discussed with patient. At the conclusion of today's visit patient requested genetic testing 8) Reviewed dating [place] today - EDD updated based on today's [place] results - reviewed findings with patient 9) NOB labs, urine UDS/cx/GC/CT  today   Zipporah Plants, CNM, MSN [facility], [facility] 12/17/2019, 4:01 PM

## 2019-12-19 LAB — RPR+RH+ABO+RUB AB+AB SCR+CB...
Antibody Screen: NEGATIVE
HIV Screen 4th Generation wRfx: NONREACTIVE
Hematocrit: 39.6 % (ref 34.0–46.6)
Hemoglobin: 13.3 g/dL (ref 11.1–15.9)
Hepatitis B Surface Ag: NEGATIVE
MCH: 33 pg (ref 26.6–33.0)
MCHC: 33.6 g/dL (ref 31.5–35.7)
MCV: 98 fL — ABNORMAL HIGH (ref 79–97)
Platelets: 215 x10E3/uL (ref 150–450)
RBC: 4.03 x10E6/uL (ref 3.77–5.28)
RDW: 11.1 % — ABNORMAL LOW (ref 11.7–15.4)
RPR Ser Ql: NONREACTIVE
Rh Factor: NEGATIVE
Rubella Antibodies, IGG: 1.84 {index} (ref >0.99)
Varicella zoster IgG: 287 {index} (ref >165)
WBC: 9.2 x10E3/uL (ref 3.4–10.8)

## 2019-12-21 LAB — CHLAMYDIA/GONOCOCCUS/TRICHOMONAS, NAA
Chlamydia by NAA: NEGATIVE
Gonococcus by NAA: NEGATIVE
Trich vag by NAA: NEGATIVE

## 2019-12-21 LAB — URINE CULTURE

## 2020-01-02 NOTE — L&D Delivery Note (Signed)
Delivery Note At  2108, a viable female was delivered vaginally.  Infant appeared LOA, with restitution to LOT. Delivery of the anterior shooulder using gentle downward guidance. A attempt to delivery the posterior shoulder was initially unsuccessful with upward guidance. The patient was asked to push more vigorously, but stopped her efforts. Additional upward guidance , with Lysle Dingwall maneuver revealed a compound hand with the poiseriori shoulder, and with additional coaching, the posterior hand and shoulder appeared.  The infant was placed upon the maternal abdomen for drying and bonding. Spontaneous respirations noted with excellent heart rate.  Intact perineum noted. .  APGAR: 89, ; weight 7lbs 15 oz. .   Placenta status:  ,intact, delivered at 2114  .  Cord: 3 vessel.  with the following complications: none .    Anesthesia:  epidural Episiotomy:  none Lacerations:  none Suture Repair:  NA Est. Blood Loss (500 mL):    Mom to postpartum.  Baby to Couplet care / Skin to Skin.  Mirna Mires 07/27/2020, 10:46 PM

## 2020-01-03 LAB — URINE DRUG PANEL 7
Amphetamines, Urine: NEGATIVE ng/mL
Barbiturate Quant, Ur: NEGATIVE ng/mL
Benzodiazepine Quant, Ur: NEGATIVE ng/mL
Cannabinoid Quant, Ur: POSITIVE — AB
Cocaine (Metab.): NEGATIVE ng/mL
Opiate Quant, Ur: NEGATIVE ng/mL
PCP Quant, Ur: NEGATIVE ng/mL

## 2020-01-14 ENCOUNTER — Encounter: Payer: Self-pay | Admitting: Advanced Practice Midwife

## 2020-01-14 ENCOUNTER — Ambulatory Visit (INDEPENDENT_AMBULATORY_CARE_PROVIDER_SITE_OTHER): Payer: Medicaid Other | Admitting: Advanced Practice Midwife

## 2020-01-14 ENCOUNTER — Other Ambulatory Visit: Payer: Self-pay

## 2020-01-14 VITALS — BP 110/70 | Ht 62.0 in | Wt 124.4 lb

## 2020-01-14 DIAGNOSIS — Z3A11 11 weeks gestation of pregnancy: Secondary | ICD-10-CM

## 2020-01-14 DIAGNOSIS — Z1379 Encounter for other screening for genetic and chromosomal anomalies: Secondary | ICD-10-CM

## 2020-01-14 DIAGNOSIS — Z3481 Encounter for supervision of other normal pregnancy, first trimester: Secondary | ICD-10-CM

## 2020-01-14 DIAGNOSIS — O219 Vomiting of pregnancy, unspecified: Secondary | ICD-10-CM

## 2020-01-14 MED ORDER — ONDANSETRON 4 MG PO TBDP: 4.0000 mg | ORAL_TABLET | Freq: Four times a day (QID) | ORAL | 2 refills | Status: DC | PRN

## 2020-01-14 NOTE — Progress Notes (Signed)
  Routine Prenatal Care Visit  Subjective  Theresa Fischer is a 25 y.o. [phone] at [redacted]w[redacted]d being seen today for ongoing prenatal care.  She is currently monitored for the following issues for this low-risk pregnancy and has Depression; Suicidal thoughts; Substance abuse (HCC); Seasonal allergies; Retained products of conception following abortion; Encounter for supervision of other normal pregnancy, first trimester; and Rh negative state in antepartum period, first trimester on their problem list.  ----------------------------------------------------------------------------------- Patient reports nausea and vomiting and having a difficult time keeping most things down. She requests medication.    . Vag. Bleeding: None.   . Leaking Fluid denies.  ----------------------------------------------------------------------------------- The following portions of the patient's history were reviewed and updated as appropriate: allergies, current medications, past family history, past medical history, past social history, past surgical history and problem list. Problem list updated.  Objective  Blood pressure 110/70, height 5\' 2"  (1.575 m), weight 124 lb 6.4 oz (56.4 kg), last menstrual period 11/06/2019. Pregravid weight 123 lb (55.8 kg) Total Weight Gain 1 lb 6.4 oz (0.635 kg) Urinalysis: Urine Protein    Urine Glucose    Fetal Status: Fetal Heart Rate (bpm): 169         General:  Alert, oriented and cooperative. Patient is in no acute distress.  Skin: Skin is warm and dry. No rash noted.   Cardiovascular: Normal heart rate noted  Respiratory: Normal respiratory effort, no problems with respiration noted  Abdomen: Soft, gravid, appropriate for gestational age.       Pelvic:  Cervical exam deferred        Extremities: Normal range of motion.     Mental Status: Normal mood and affect. Normal behavior. Normal judgment and thought content.   Assessment   25 y.o. 30 at [redacted]w[redacted]d by  08/03/2020, by  Ultrasound presenting for routine prenatal visit  Plan   pregnancy3 Problems (from 10/14/19 to present)    Problem Noted Resolved   Encounter for supervision of other normal pregnancy, first trimester 12/17/2019 by 12/19/2019, CNM No   Overview Signed 12/17/2019  4:01 PM by 12/19/2019, CNM    [facility] Prenatal Labs  Dating  7w Zipporah Plants Blood type:     Genetic Screen  NIPS: Desires Antibody:   Anatomic [place]  Rubella:   Varicella: @VZVIGG @  GTT Early: n/a              Third trimester:  RPR:     Rhogam  HBsAg:     TDaP vaccine                       Flu Shot:  HIV:     Baby Food                                GBS:   Contraception  Pap:  CBB     CS/VBAC    Support Person            Rh negative state in antepartum period, first trimester 12/17/2019 by , CNM No       Preterm labor symptoms and general obstetric precautions including but not limited to vaginal bleeding, contractions, leaking of fluid and fetal movement were reviewed in detail with the patient. Please refer to After Visit Summary for other counseling recommendations.   Return in about 4 weeks (around 02/11/2020) for rob.  Zipporah Plants, CNM 01/14/2020 10:28 AM

## 2020-01-14 NOTE — Progress Notes (Signed)
ROB

## 2020-01-14 NOTE — Patient Instructions (Signed)

## 2020-01-19 LAB — MATERNIT21 PLUS CORE+SCA
Fetal Fraction: 12
Monosomy X (Turner Syndrome): NOT DETECTED
Result (T21): NEGATIVE
Trisomy 13 (Patau syndrome): NEGATIVE
Trisomy 18 (Edwards syndrome): NEGATIVE
Trisomy 21 (Down syndrome): NEGATIVE
XXX (Triple X Syndrome): NOT DETECTED
XXY (Klinefelter Syndrome): NOT DETECTED
XYY (Jacobs Syndrome): NOT DETECTED

## 2020-02-11 ENCOUNTER — Encounter: Payer: Medicaid Other | Admitting: Obstetrics & Gynecology

## 2020-02-12 ENCOUNTER — Encounter: Payer: Medicaid Other | Admitting: Obstetrics and Gynecology

## 2020-02-15 ENCOUNTER — Encounter: Payer: Medicaid Other | Admitting: Obstetrics

## 2020-02-15 ENCOUNTER — Other Ambulatory Visit: Payer: Self-pay

## 2020-02-15 ENCOUNTER — Encounter: Payer: Self-pay | Admitting: Physician Assistant

## 2020-02-15 ENCOUNTER — Telehealth: Payer: Medicaid Other | Admitting: Physician Assistant

## 2020-02-16 ENCOUNTER — Ambulatory Visit (INDEPENDENT_AMBULATORY_CARE_PROVIDER_SITE_OTHER): Payer: Medicaid Other | Admitting: Obstetrics

## 2020-02-16 ENCOUNTER — Encounter: Payer: Self-pay | Admitting: Obstetrics

## 2020-02-16 VITALS — BP 98/58 | Wt 124.0 lb

## 2020-02-16 DIAGNOSIS — Z3481 Encounter for supervision of other normal pregnancy, first trimester: Secondary | ICD-10-CM

## 2020-02-16 DIAGNOSIS — Z3A15 15 weeks gestation of pregnancy: Secondary | ICD-10-CM

## 2020-02-16 DIAGNOSIS — Z3A18 18 weeks gestation of pregnancy: Secondary | ICD-10-CM

## 2020-02-16 LAB — POCT URINALYSIS DIPSTICK OB
Glucose, UA: NEGATIVE
POC,PROTEIN,UA: NEGATIVE

## 2020-02-16 NOTE — Progress Notes (Signed)
Routine Prenatal Care Visit  Subjective  Theresa Fischer is a 25 y.o. [phone] at [redacted]w[redacted]d being seen today for ongoing prenatal care.  She is currently monitored for the following issues for this high-risk pregnancy and has Depression; Substance abuse (HCC); Seasonal allergies; Encounter for supervision of other normal pregnancy, first trimester; and Rh negative state in antepartum period, first trimester on their problem list.  ----------------------------------------------------------------------------------- Patient reports no complaints. She does share a hx of depression and expresses an interest in exploring medication for treatment.  . Vag. Bleeding: None.  Movement: Present. Leaking Fluid denies.  ----------------------------------------------------------------------------------- The following portions of the patient's history were reviewed and updated as appropriate: allergies, current medications, past family history, past medical history, past social history, past surgical history and problem list. Problem list updated.  Objective  Blood pressure (!) 98/58, weight 124 lb (56.2 kg), last menstrual period 11/06/2019. Pregravid weight 123 lb (55.8 kg) Total Weight Gain 1 lb (0.454 kg) Urinalysis: Urine Protein Negative  Urine Glucose Negative  Fetal Status:     Movement: Present     General:  Alert, oriented and cooperative. Patient is in no acute distress.  Skin: Skin is warm and dry. No rash noted.   Cardiovascular: Normal heart rate noted  Respiratory: Normal respiratory effort, no problems with respiration noted  Abdomen: Soft, gravid, appropriate for gestational age. Pain/Pressure: Absent     Pelvic:  Cervical exam deferred        Extremities: Normal range of motion.     Mental Status: Normal mood and affect. Normal behavior. Normal judgment and thought content.   Assessment   25 y.o. [id number] at [redacted]w[redacted]d by  08/03/2020, by Ultrasound presenting for routine prenatal visit  Plan    pregnancy3 Problems (from 10/14/19 to present)    Problem Noted Resolved   Encounter for supervision of other normal pregnancy, first trimester 12/17/2019 by Zipporah Plants, CNM No   Overview Addendum 02/16/2020  3:22 PM by Mirna Mires, CNM    [facility] Prenatal Labs  Dating  7w [place] Blood type:   A negative  Genetic Screen  NIPS: Desires xy, normal Antibody: negative  Anatomic [place]  immune  GTT Early: n/a              Third trimester:  RPR:     Rhogam  HBsAg:     TDaP vaccine                       Flu Shot:  HIV:     Baby Food                                GBS:   Contraception  Pap:  CBB     CS/VBAC    Support Person            Previous Version   Rh negative state in antepartum period, first trimester 12/17/2019 by Zipporah Plants, CNM No      Symptoms of depression shared- no formal treatment or evalaution Preterm labor symptoms and general obstetric precautions including but not limited to vaginal bleeding, contractions, leaking of fluid and fetal movement were reviewed in detail with the patient. Please refer to After Visit Summary for other counseling recommendations.   Return in about 2 weeks (around 03/01/2020) for return OB, anatomy scan.  Will plan on administering the PHQ and GAD next visit- interest in SSRI trial.  [name]  Liana Crocker, CNM  02/16/2020 3:25 PM

## 2020-02-16 NOTE — Progress Notes (Signed)
No complaints

## 2020-03-01 ENCOUNTER — Emergency Department
Admission: EM | Admit: 2020-03-01 | Discharge: 2020-03-01 | Disposition: A | Discharge status: Home / Self Care | Payer: Medicaid Other | Attending: Emergency Medicine | Admitting: Emergency Medicine

## 2020-03-01 ENCOUNTER — Ambulatory Visit: Payer: Medicaid Other

## 2020-03-01 ENCOUNTER — Encounter: Payer: Self-pay | Admitting: Emergency Medicine

## 2020-03-01 ENCOUNTER — Telehealth: Payer: Self-pay

## 2020-03-01 ENCOUNTER — Ambulatory Visit: Admission: RE | Admit: 2020-03-01 | Payer: Medicaid Other | Source: Ambulatory Visit

## 2020-03-01 DIAGNOSIS — O99891 Other specified diseases and conditions complicating pregnancy: Secondary | ICD-10-CM

## 2020-03-01 DIAGNOSIS — O26892 Other specified pregnancy related conditions, second trimester: Secondary | ICD-10-CM | POA: Diagnosis present

## 2020-03-01 DIAGNOSIS — R1030 Lower abdominal pain, unspecified: Secondary | ICD-10-CM | POA: Diagnosis not present

## 2020-03-01 DIAGNOSIS — Z5321 Procedure and treatment not carried out due to patient leaving prior to being seen by health care provider: Secondary | ICD-10-CM | POA: Insufficient documentation

## 2020-03-01 DIAGNOSIS — Z3A00 Weeks of gestation of pregnancy not specified: Secondary | ICD-10-CM | POA: Diagnosis not present

## 2020-03-01 LAB — CBC
HCT: 35.6 % — ABNORMAL LOW (ref 36.0–46.0)
Hemoglobin: 12.6 g/dL (ref 12.0–15.0)
MCH: 34.7 pg — ABNORMAL HIGH (ref 26.0–34.0)
MCHC: 35.4 g/dL (ref 30.0–36.0)
MCV: 98.1 fL (ref 80.0–100.0)
Platelets: 194 10*3/uL (ref 150–400)
RBC: 3.63 MIL/uL — ABNORMAL LOW (ref 3.87–5.11)
RDW: 12.3 % (ref 11.5–15.5)
WBC: 10.5 10*3/uL (ref 4.0–10.5)
nRBC: 0 % (ref 0.0–0.2)

## 2020-03-01 LAB — COMPREHENSIVE METABOLIC PANEL
ALT: 34 U/L (ref 0–44)
AST: 26 U/L (ref 15–41)
Albumin: 3.7 g/dL (ref 3.5–5.0)
Alkaline Phosphatase: 49 U/L (ref 38–126)
Anion gap: 9 (ref 5–15)
BUN: 8 mg/dL (ref 6–20)
CO2: 21 mmol/L — ABNORMAL LOW (ref 22–32)
Calcium: 9.2 mg/dL (ref 8.9–10.3)
Chloride: 103 mmol/L (ref 98–111)
Creatinine, Ser: 0.58 mg/dL (ref 0.44–1.00)
GFR, Estimated: >60 mL/min (ref >60)
Glucose, Bld: 98 mg/dL (ref 70–99)
Potassium: 3.7 mmol/L (ref 3.5–5.1)
Sodium: 133 mmol/L — ABNORMAL LOW (ref 135–145)
Total Bilirubin: 0.4 mg/dL (ref 0.3–1.2)
Total Protein: 6.7 g/dL (ref 6.5–8.1)

## 2020-03-01 LAB — LIPASE, BLOOD: Lipase: 28 U/L (ref 11–51)

## 2020-03-01 NOTE — ED Notes (Signed)
Pt states she is going to leave and not stay. Pt states she is going to go somewhere else.

## 2020-03-01 NOTE — Telephone Encounter (Signed)
Pt tx'd from SP; 17wks; c/o lower stomach right side vaginal pain to leg; unable to stand straight; has been changing positions, rolling around on the bed, using the bathroom; nothing helps; pain is getting worse.  Is on her way to the ED.  Adv that's what she needs to do.

## 2020-03-01 NOTE — ED Triage Notes (Signed)
Pt estimates she is about 5 mo's pregnant - EDD 08/03/20 in chart. States she is having low abdominal cramping, no vaginal bleeding or discharge present. Pain is more R sided, has been having nausea throughout pregnancy, no more than usual, no diarrhea

## 2020-03-03 ENCOUNTER — Encounter: Payer: Medicaid Other | Admitting: Obstetrics

## 2020-03-03 ENCOUNTER — Encounter: Payer: Medicaid Other | Admitting: Obstetrics and Gynecology

## 2020-03-03 ENCOUNTER — Ambulatory Visit: Payer: Medicaid Other

## 2020-03-03 ENCOUNTER — Other Ambulatory Visit: Payer: Medicaid Other

## 2020-03-03 DIAGNOSIS — Z3481 Encounter for supervision of other normal pregnancy, first trimester: Secondary | ICD-10-CM

## 2020-03-03 DIAGNOSIS — Z3A18 18 weeks gestation of pregnancy: Secondary | ICD-10-CM

## 2020-03-04 ENCOUNTER — Ambulatory Visit
Admission: RE | Admit: 2020-03-04 | Discharge: 2020-03-04 | Disposition: A | Discharge status: Home / Self Care | Payer: Medicaid Other | Source: Ambulatory Visit | Attending: Obstetrics | Admitting: Obstetrics

## 2020-03-04 ENCOUNTER — Other Ambulatory Visit: Payer: Self-pay

## 2020-03-04 DIAGNOSIS — Z3A18 18 weeks gestation of pregnancy: Secondary | ICD-10-CM | POA: Diagnosis present

## 2020-03-04 DIAGNOSIS — Z3481 Encounter for supervision of other normal pregnancy, first trimester: Secondary | ICD-10-CM | POA: Diagnosis present

## 2020-03-07 ENCOUNTER — Encounter: Payer: Medicaid Other | Admitting: Obstetrics

## 2020-03-09 ENCOUNTER — Emergency Department: Payer: Medicaid Other

## 2020-03-09 ENCOUNTER — Emergency Department
Admission: EM | Admit: 2020-03-09 | Discharge: 2020-03-09 | Disposition: A | Discharge status: Home / Self Care | Payer: Medicaid Other | Attending: Emergency Medicine | Admitting: Emergency Medicine

## 2020-03-09 ENCOUNTER — Other Ambulatory Visit: Payer: Self-pay

## 2020-03-09 DIAGNOSIS — F17210 Nicotine dependence, cigarettes, uncomplicated: Secondary | ICD-10-CM | POA: Diagnosis not present

## 2020-03-09 DIAGNOSIS — R1031 Right lower quadrant pain: Secondary | ICD-10-CM | POA: Diagnosis not present

## 2020-03-09 DIAGNOSIS — O99332 Smoking (tobacco) complicating pregnancy, second trimester: Secondary | ICD-10-CM | POA: Diagnosis not present

## 2020-03-09 DIAGNOSIS — O26892 Other specified pregnancy related conditions, second trimester: Secondary | ICD-10-CM | POA: Diagnosis present

## 2020-03-09 DIAGNOSIS — Z3A19 19 weeks gestation of pregnancy: Secondary | ICD-10-CM

## 2020-03-09 LAB — COMPREHENSIVE METABOLIC PANEL
ALT: 19 U/L (ref 0–44)
AST: 21 U/L (ref 15–41)
Albumin: 3.6 g/dL (ref 3.5–5.0)
Alkaline Phosphatase: 50 U/L (ref 38–126)
Anion gap: 8 (ref 5–15)
BUN: 12 mg/dL (ref 6–20)
CO2: 22 mmol/L (ref 22–32)
Calcium: 9 mg/dL (ref 8.9–10.3)
Chloride: 106 mmol/L (ref 98–111)
Creatinine, Ser: 0.62 mg/dL (ref 0.44–1.00)
GFR, Estimated: >60 mL/min (ref >60)
Glucose, Bld: 82 mg/dL (ref 70–99)
Potassium: 3.7 mmol/L (ref 3.5–5.1)
Sodium: 136 mmol/L (ref 135–145)
Total Bilirubin: 0.5 mg/dL (ref 0.3–1.2)
Total Protein: 6.7 g/dL (ref 6.5–8.1)

## 2020-03-09 LAB — URINALYSIS, COMPLETE (UACMP) WITH MICROSCOPIC
Bilirubin Urine: NEGATIVE
Glucose, UA: NEGATIVE mg/dL
Hgb urine dipstick: NEGATIVE
Ketones, ur: 5 mg/dL — AB
Nitrite: NEGATIVE
Protein, ur: NEGATIVE mg/dL
Specific Gravity, Urine: 1.028 (ref 1.005–1.030)
pH: 5 (ref 5.0–8.0)

## 2020-03-09 LAB — CBC
HCT: 33.4 % — ABNORMAL LOW (ref 36.0–46.0)
Hemoglobin: 11.2 g/dL — ABNORMAL LOW (ref 12.0–15.0)
MCH: 33.3 pg (ref 26.0–34.0)
MCHC: 33.5 g/dL (ref 30.0–36.0)
MCV: 99.4 fL (ref 80.0–100.0)
Platelets: 212 10*3/uL (ref 150–400)
RBC: 3.36 MIL/uL — ABNORMAL LOW (ref 3.87–5.11)
RDW: 12.2 % (ref 11.5–15.5)
WBC: 12.3 10*3/uL — ABNORMAL HIGH (ref 4.0–10.5)
nRBC: 0 % (ref 0.0–0.2)

## 2020-03-09 LAB — LIPASE, BLOOD: Lipase: 30 U/L (ref 11–51)

## 2020-03-09 MED ORDER — ONDANSETRON HCL 4 MG/2ML IJ SOLN
INTRAMUSCULAR | Status: AC
  Administered 2020-03-09: 4 mg via INTRAVENOUS
  Filled 2020-03-09: qty 2

## 2020-03-09 MED ORDER — MORPHINE SULFATE (PF) 4 MG/ML IV SOLN
4.0000 mg | Freq: Once | INTRAVENOUS | Status: AC
  Administered 2020-03-09: 4 mg via INTRAVENOUS
  Filled 2020-03-09: qty 1

## 2020-03-09 MED ORDER — ONDANSETRON HCL 4 MG/2ML IJ SOLN: 4.0000 mg | Freq: Once | INTRAMUSCULAR | Status: AC

## 2020-03-09 MED ORDER — ACETAMINOPHEN 500 MG PO TABS
1000.0000 mg | ORAL_TABLET | Freq: Once | ORAL | Status: AC
  Administered 2020-03-09: 1000 mg via ORAL
  Filled 2020-03-09: qty 2

## 2020-03-09 NOTE — ED Notes (Signed)
US at bedside

## 2020-03-09 NOTE — ED Notes (Signed)
Patient provided with crackers and water for PO challenge.

## 2020-03-09 NOTE — ED Notes (Signed)
MRI screening in progress 

## 2020-03-09 NOTE — ED Provider Notes (Signed)
[facility] Emergency Department Provider Note ____________________________________________   Event Date/Time   First MD Initiated Contact with Patient 03/09/20 1745     (approximate)  I have reviewed the triage vital signs and the nursing notes.  HISTORY  Chief Complaint Abdominal Pain   HPI Theresa Fischer is a 25 y.o. femalewho presents to the ED for evaluation of abdominal pain.  Chart review indicates bipolar disorder. G4, P2 A1 at about [redacted] weeks gestation currently. Patient checked in to our ED 1 week ago for the same, but left without being seen.  Patient presents to the ED for evaluation of 36 hours of RLQ abdominal pain.  She reports this was similar pain that she felt a week ago when she checked in, but it got better for a few days, before returning yesterday morning.  She reports isolated pain to her lower abdomen, primarily to the RLQ, occasionally to the LLQ and RUQ.  She reports occasional nausea with the pain, but denies any emesis, stool changes such as diarrhea, vaginal discharge or bleeding.  She was report dysuria where she has worsening of her RLQ pain with voiding.  She reports transient improvement of her pain with Tylenol, but it came right back.  Past Medical History:  Diagnosis Date  . Bipolar 1 disorder (HCC)   . Depression    Bipolar  . Seasonal allergies     Patient Active Problem List   Diagnosis Date Noted  . Encounter for supervision of other normal pregnancy, first trimester 12/17/2019  . Rh negative state in antepartum period, first trimester 12/17/2019  . Seasonal allergies   . Depression 12/10/2011  . Substance abuse (HCC) 12/10/2011    Past Surgical History:  Procedure Laterality Date  . DILATION AND EVACUATION N/A 10/16/2016   Procedure: DILATATION AND EVACUATION;  Surgeon: Conard Novak, MD;  Location: [facility] ORS;  Service: Gynecology;  Laterality: N/A;  . MYRINGOTOMY    . NO PAST SURGERIES      Prior  to Admission medications   Medication Sig Start Date End Date Taking? Authorizing Provider  ondansetron (ZOFRAN ODT) 4 MG disintegrating tablet Take 1 tablet (4 mg total) by mouth every 6 (six) hours as needed for nausea. Patient not taking: Reported on 02/16/2020 01/14/20   Tresea Mall, CNM  Prenatal Vit-Fe Fumarate-FA (MULTIVITAMIN-PRENATAL) 27-0.8 MG TABS tablet Take 1 tablet by mouth daily at 12 noon. Patient not taking: Reported on 02/16/2020    [provider]    Allergies Patient has no known allergies.  Family History  Problem Relation Age of Onset  . CAD Mother     Social History Social History   Tobacco Use  . Smoking status: Current Every Day Smoker    Packs/day: 0.20    Types: Cigarettes  . Smokeless tobacco: Never Used  Vaping Use  . Vaping Use: Never used  Substance Use Topics  . Alcohol use: Not Currently    Comment: occ  . Drug use: No    Types: Marijuana    Comment: Last use August 2016    Review of Systems  Constitutional: No fever/chills Eyes: No visual changes. ENT: No sore throat. Cardiovascular: Denies chest pain. Respiratory: Denies shortness of breath. Gastrointestinal: Positive for abdominal pain and nausea.  no vomiting.  No diarrhea.  No constipation. Genitourinary: Positive for dysuria.  Negative for vaginal bleeding or discharge. Musculoskeletal: Negative for back pain. Skin: Negative for rash. Neurological: Negative for headaches, focal weakness or numbness.  ____________________________________________  PHYSICAL EXAM:  VITAL SIGNS: Vitals:   03/09/20 1737 03/09/20 2036  BP: 119/70 120/67  Pulse: 85 87  Resp: 17 16  Temp: 98.3 F (36.8 C)   SpO2: 100% 99%     Constitutional: Alert and oriented. Well appearing and in no acute distress. Eyes: Conjunctivae are normal. PERRL. EOMI. Head: Atraumatic. Nose: No congestion/rhinnorhea. Mouth/Throat: Mucous membranes are moist.  Oropharynx non-erythematous. Neck: No  stridor. No cervical spine tenderness to palpation. Cardiovascular: Normal rate, regular rhythm. Grossly normal heart sounds.  Good peripheral circulation. Respiratory: Normal respiratory effort.  No retractions. Lungs CTAB. Gastrointestinal: Soft , gravid. RLQ tenderness with voluntary guarding.  Suprapubic and RUQ tenderness is present without peritoneal features.  Benign RUQ. Musculoskeletal: No lower extremity tenderness nor edema.  No joint effusions. No signs of acute trauma. Neurologic:  Normal speech and language. No gross focal neurologic deficits are appreciated. No gait instability noted. Skin:  Skin is warm, dry and intact. No rash noted. Psychiatric: Mood and affect are normal. Speech and behavior are normal. ____________________________________________   LABS (all labs ordered are listed, but only abnormal results are displayed)  Labs Reviewed  CBC - Abnormal; Notable for the following components:      Result Value   WBC 12.3 (*)    RBC 3.36 (*)    Hemoglobin 11.2 (*)    HCT 33.4 (*)    All other components within normal limits  URINALYSIS, COMPLETE (UACMP) WITH MICROSCOPIC - Abnormal; Notable for the following components:   Color, Urine YELLOW (*)    APPearance HAZY (*)    Ketones, ur 5 (*)    Leukocytes,Ua TRACE (*)    All other components within normal limits  LIPASE, BLOOD  COMPREHENSIVE METABOLIC PANEL   ____________________________________________  12 Lead EKG   ____________________________________________  RADIOLOGY  ED MD interpretation: Obstetric ultrasound reviewed by me with IUP in place without evidence of associated pathology.  Official radiology report(s): MR PELVIS WO CONTRAST  Result Date: 03/09/2020 CLINICAL DATA:  Right lower quadrant abdominal pain. Pregnant at [redacted] weeks gestation. Evaluate for acute appendicitis. EXAM: MRI ABDOMEN AND PELVIS WITHOUT CONTRAST TECHNIQUE: Multiplanar multisequence MR imaging of the abdomen and pelvis was  performed. No intravenous contrast was administered. COMPARISON:  Obstetric ultrasound same date. Abdominopelvic CT 10/20/2017 FINDINGS: COMBINED FINDINGS FOR BOTH MR ABDOMEN AND PELVIS Lower chest:  The visualized lower chest appears unremarkable. Hepatobiliary: The liver is normal in signal without suspicious focal abnormality. No evidence of gallstones, gallbladder wall thickening or biliary dilatation. Pancreas: Unremarkable. No pancreatic ductal dilatation or surrounding inflammatory changes. Spleen: Normal in size without focal abnormality. Adrenals/Urinary Tract: Both adrenal glands appear normal. Both kidneys appear normal without evidence of hydronephrosis or perinephric soft tissue stranding. The bladder appears normal. Stomach/Bowel: The stomach appears unremarkable for its degree of distension. No evidence of bowel wall thickening, distention or surrounding inflammatory change. A normal appearing appendix is best seen on images 12 through 17 of series 7. There are no surrounding inflammatory changes. Vascular/Lymphatic: There are no enlarged abdominal or pelvic lymph nodes. No significant vascular findings. Reproductive: Gravid uterus is noted. The fetus is in cephalic presentation. The placenta is anteriorly located above the cervical os. Other: No evidence of abdominal wall mass or hernia. No ascites. Musculoskeletal: No acute or significant osseous findings. IMPRESSION: 1. No acute findings or explanation for the patient's symptoms. Specifically, no evidence of appendicitis. 2. Gravid uterus. Electronically Signed   By: Carey BullocksWilliam  Veazey M.D.   On: 03/09/2020 21:36  MR ABDOMEN WO CONTRAST  Result Date: 03/09/2020 CLINICAL DATA:  Right lower quadrant abdominal pain. Pregnant at [redacted] weeks gestation. Evaluate for acute appendicitis. EXAM: MRI ABDOMEN AND PELVIS WITHOUT CONTRAST TECHNIQUE: Multiplanar multisequence MR imaging of the abdomen and pelvis was performed. No intravenous contrast was  administered. COMPARISON:  Obstetric ultrasound same date. Abdominopelvic CT 10/20/2017 FINDINGS: COMBINED FINDINGS FOR BOTH MR ABDOMEN AND PELVIS Lower chest:  The visualized lower chest appears unremarkable. Hepatobiliary: The liver is normal in signal without suspicious focal abnormality. No evidence of gallstones, gallbladder wall thickening or biliary dilatation. Pancreas: Unremarkable. No pancreatic ductal dilatation or surrounding inflammatory changes. Spleen: Normal in size without focal abnormality. Adrenals/Urinary Tract: Both adrenal glands appear normal. Both kidneys appear normal without evidence of hydronephrosis or perinephric soft tissue stranding. The bladder appears normal. Stomach/Bowel: The stomach appears unremarkable for its degree of distension. No evidence of bowel wall thickening, distention or surrounding inflammatory change. A normal appearing appendix is best seen on images 12 through 17 of series 7. There are no surrounding inflammatory changes. Vascular/Lymphatic: There are no enlarged abdominal or pelvic lymph nodes. No significant vascular findings. Reproductive: Gravid uterus is noted. The fetus is in cephalic presentation. The placenta is anteriorly located above the cervical os. Other: No evidence of abdominal wall mass or hernia. No ascites. Musculoskeletal: No acute or significant osseous findings. IMPRESSION: 1. No acute findings or explanation for the patient's symptoms. Specifically, no evidence of appendicitis. 2. Gravid uterus. Electronically Signed   By: Carey Bullocks M.D.   On: 03/09/2020 21:36   US OB Limited  Result Date: 03/09/2020 CLINICAL DATA:  Right lower quadrant pain. EXAM: LIMITED OBSTETRIC ULTRASOUND COMPARISON:  March 04, 2020 FINDINGS: Number of Fetuses: 1 Heart Rate:  145 bpm Movement: Yes Presentation: Cephalic Placental Location: Anterior Previa: No Amniotic Fluid (Subjective):  Within normal limits. AFI: 4.5 cm BPD: 4.7 cm 20 w  2 d MATERNAL FINDINGS:  Cervix:  Appears closed (4.8 cm in length). Uterus/Adnexae: No abnormality visualized. IMPRESSION: Single, viable intrauterine pregnancy at approximately 20 weeks and 2 days gestation by ultrasound evaluation. This exam is performed on an emergent basis and does not comprehensively evaluate fetal size, dating, or anatomy; follow-up complete OB US should be considered if further fetal assessment is warranted. Electronically Signed   By: Aram Candela M.D.   On: 03/09/2020 19:58    ____________________________________________   PROCEDURES and INTERVENTIONS  Procedure(s) performed (including Critical Care):  .1-3 Lead EKG Interpretation Performed by: Delton Prairie, MD Authorized by: Delton Prairie, MD     Interpretation: normal     ECG rate:  82   ECG rate assessment: normal     Rhythm: sinus rhythm     Ectopy: none     Conduction: normal      Medications  acetaminophen (TYLENOL) tablet 1,000 mg (1,000 mg Oral Given 03/09/20 1836)  morphine 4 MG/ML injection 4 mg (4 mg Intravenous Given 03/09/20 2032)  ondansetron (ZOFRAN) injection 4 mg (4 mg Intravenous Given 03/09/20 2033)    ____________________________________________   MDM / ED COURSE   25 year old female at about [redacted] weeks gestation presents to the ED with acute RLQ pain, without evidence of acute pathology, and ultimately amenable to outpatient management.  Normal vitals on room air.  Exam demonstrates an uncomfortable and gravid patient with RLQ tenderness and Rovsing positive, concerning for acute appendicitis.  She has no vaginal bleeding to suggest obstetric pathology or miscarriage.  Limited obstetric ultrasound obtained demonstrates IUP in place with  appropriate heart rate and no signs of abruption or other sources of obstetric-related pain.  Blood work with mild leukocytosis.  Considering her leukocytosis and exam findings, concern for acute appendicitis.  I discussed the case with general surgery, who recommends MRI, and this  is performed without evidence of acute intra-abdominal pathology.  Patient's pain is well controlled and she is tolerating p.o. intake without complication.  Urine is without infectious features to suggest acute cystitis.  I see no evidence of acute pathology to preclude outpatient management.  We discussed return precautions for the ED.  Patient medically stable for outpatient management.  Clinical Course as of 03/09/20 2219  Wed Mar 09, 2020  1845 Call from u/s , they do not perform appy u/s on pregnant patients.  [DS]  2011 Reassessed.  Educated patient of reassuring obstetric ultrasound.  She is clearly uncomfortable and clutching her right lower quadrant abdomen.  Continues to be tender to the RLQ and is Rovsing positive.  We discussed my high suspicion for acute appendicitis and need for MRI.  Surgical evaluation and additional IV analgesia.  She is agreeable. [DS]  2016 I discussed the case with surgery on-call, Dr. Claudine Mouton, he recommends MRI [DS]  2202 Reassessed.  Patient looks clinically well and improved.  We discussed reassuring MRI without evidence of acute appendicitis.  Reassessed her abdomen and she has minimal to no pain to her RLQ. [DS]    Clinical Course User Index [DS] Delton Prairie, MD    ____________________________________________   FINAL CLINICAL IMPRESSION(S) / ED DIAGNOSES  Final diagnoses:  RLQ abdominal pain  [redacted] weeks gestation of pregnancy     ED Discharge Orders    None       Sheenah Dimitroff Katrinka Blazing   Note:  This document was prepared using Dragon voice recognition software and may include unintentional dictation errors.   Delton Prairie, MD 03/09/20 2222

## 2020-03-09 NOTE — ED Notes (Signed)
Patient to MRI.

## 2020-03-09 NOTE — Discharge Instructions (Signed)
Use Tylenol for pain and fevers.  Up to 1000 mg per dose, up to 4 times per day.  Do not take more than 4000 mg of Tylenol/acetaminophen within 24 hours..  As we discussed, please follow-up with your OB/GYN and return to the ED with any significantly worsening pain.  Return to the ED with any vaginal bleeding or fevers.

## 2020-03-09 NOTE — ED Notes (Signed)
Patient returned from MRI.

## 2020-03-09 NOTE — ED Triage Notes (Signed)
Pt arrived POV co pain in the lower abd/groin area.Pt was seen here about a week ago for the same issue.   While pt was at work the pain came back and was worse than before. Pt states that she is able to feel baby move and the last time she felt the movement was this morning. Fetal heart tones palpable with ultrasound at 125. Pt states when she pees the pain is even worse and describes it as a "stabbing pain." She says had a UTI before and it does not feel like that type of pain.

## 2020-03-22 ENCOUNTER — Encounter: Payer: Medicaid Other | Admitting: Obstetrics

## 2020-04-07 ENCOUNTER — Ambulatory Visit (INDEPENDENT_AMBULATORY_CARE_PROVIDER_SITE_OTHER): Payer: Medicaid Other | Admitting: Obstetrics and Gynecology

## 2020-04-07 ENCOUNTER — Encounter: Payer: Self-pay | Admitting: Obstetrics and Gynecology

## 2020-04-07 VITALS — BP 122/70 | Wt 130.0 lb

## 2020-04-07 DIAGNOSIS — Z3480 Encounter for supervision of other normal pregnancy, unspecified trimester: Secondary | ICD-10-CM

## 2020-04-07 DIAGNOSIS — Z131 Encounter for screening for diabetes mellitus: Secondary | ICD-10-CM

## 2020-04-07 DIAGNOSIS — O26891 Other specified pregnancy related conditions, first trimester: Secondary | ICD-10-CM

## 2020-04-07 DIAGNOSIS — Z6791 Unspecified blood type, Rh negative: Secondary | ICD-10-CM

## 2020-04-07 DIAGNOSIS — Z3A23 23 weeks gestation of pregnancy: Secondary | ICD-10-CM

## 2020-04-07 DIAGNOSIS — Z113 Encounter for screening for infections with a predominantly sexual mode of transmission: Secondary | ICD-10-CM

## 2020-04-07 NOTE — Progress Notes (Signed)
  Routine Prenatal Care Visit  Subjective  Theresa Fischer is a 25 y.o. [phone] at [redacted]w[redacted]d being seen today for ongoing prenatal care.  She is currently monitored for the following issues for this low-risk pregnancy and has Depression; Substance abuse (HCC); Seasonal allergies; Supervision of other normal pregnancy, antepartum; and Rh negative state in antepartum period, first trimester on their problem list.  ----------------------------------------------------------------------------------- Patient reports no complaints.    . Vag. Bleeding: None.  Movement: Present. Leaking Fluid denies.  ----------------------------------------------------------------------------------- The following portions of the patient's history were reviewed and updated as appropriate: allergies, current medications, past family history, past medical history, past social history, past surgical history and problem list. Problem list updated.  Objective  Blood pressure 122/70, weight 130 lb (59 kg), last menstrual period 11/06/2019. Pregravid weight 123 lb (55.8 kg) Total Weight Gain 7 lb (3.175 kg) Urinalysis: Urine Protein    Urine Glucose    Fetal Status: Fetal Heart Rate (bpm): 150   Movement: Present     General:  Alert, oriented and cooperative. Patient is in no acute distress.  Skin: Skin is warm and dry. No rash noted.   Cardiovascular: Normal heart rate noted  Respiratory: Normal respiratory effort, no problems with respiration noted  Abdomen: Soft, gravid, appropriate for gestational age. Pain/Pressure: Absent     Pelvic:  Cervical exam deferred        Extremities: Normal range of motion.     Mental Status: Normal mood and affect. Normal behavior. Normal judgment and thought content.   Assessment   25 y.o. [id number] at [redacted]w[redacted]d by  08/03/2020, by Ultrasound presenting for routine prenatal visit  Plan   pregnancy3 Problems (from 10/14/19 to present)    Problem Noted Resolved   Supervision of other normal  pregnancy, antepartum 12/17/2019 by Zipporah Plants, CNM No   Overview Addendum 04/07/2020 11:35 AM by Conard Novak, MD    Clinic [facility] Prenatal Labs  Dating  7w [place] Blood type: A/Negative/-- (12/16 1206)   Genetic Screen  NIPS: xy, normal Antibody:Negative (12/16 1206)  Anatomic [place] Normal female. One echogenic focus noted -heart immune  GTT Early: n/a              Third trimester:  RPR: Non Reactive (12/16 1206)   Rhogam  HBsAg: Negative (12/16 1206)   TDaP vaccine                       Flu Shot:  HIV: Non Reactive (12/16 1206)   Baby Food                                GBS:   Contraception  Pap:  CBB     CS/VBAC    Support Person            Previous Version   Rh negative state in antepartum period, first trimester 12/17/2019 by Zipporah Plants, CNM No       Preterm labor symptoms and general obstetric precautions including but not limited to vaginal bleeding, contractions, leaking of fluid and fetal movement were reviewed in detail with the patient. Please refer to After Visit Summary for other counseling recommendations.   Return in about 4 weeks (around 05/05/2020) for 28 week labs with Routine Prenatal Appointment.   Thomasene Mohair, MD, [facility], [facility] 04/07/2020 12:03 PM

## 2020-04-22 ENCOUNTER — Telehealth: Payer: Self-pay

## 2020-04-22 NOTE — Telephone Encounter (Signed)
Pt calling to see what she can take for a cold.  [phone]  Adv for cough - plain robitussin/musinex, Hall's cough drops; for sore throat - warm salt water gargles, sucrets, chloraseptic spray; for nose/sinuses - plain sudafed, saline nasal spray, sip hot beverages/soup just watch caffeine intake as only allowed 16oz of caffeine a day; aches, pains, fever - e.s. tylenol; push fluids and rest.  To do a covid test; is positive to call HD at [phone]

## 2020-04-27 ENCOUNTER — Ambulatory Visit (INDEPENDENT_AMBULATORY_CARE_PROVIDER_SITE_OTHER): Payer: Medicaid Other | Admitting: Obstetrics and Gynecology

## 2020-04-27 ENCOUNTER — Other Ambulatory Visit: Payer: Self-pay

## 2020-04-27 VITALS — BP 98/58 | Wt 132.0 lb

## 2020-04-27 DIAGNOSIS — Z6791 Unspecified blood type, Rh negative: Secondary | ICD-10-CM

## 2020-04-27 DIAGNOSIS — O26891 Other specified pregnancy related conditions, first trimester: Secondary | ICD-10-CM

## 2020-04-27 DIAGNOSIS — Z3480 Encounter for supervision of other normal pregnancy, unspecified trimester: Secondary | ICD-10-CM

## 2020-04-27 LAB — POCT URINALYSIS DIPSTICK OB
Appearance: NORMAL
Bilirubin, UA: NEGATIVE
Blood, UA: NEGATIVE
Glucose, UA: NEGATIVE
Ketones, UA: NEGATIVE
Leukocytes, UA: NEGATIVE
Nitrite, UA: NEGATIVE
Odor: NORMAL
POC,PROTEIN,UA: NEGATIVE
Spec Grav, UA: 1.01 (ref 1.010–1.025)
Urobilinogen, UA: 0.2 E.U./dL
pH, UA: 7.5 (ref 5.0–8.0)

## 2020-04-27 NOTE — Progress Notes (Signed)
C/o low back pain since last night. No contractions.

## 2020-04-27 NOTE — Progress Notes (Signed)
Routine Prenatal Care Visit  Subjective  Theresa Fischer is a 25 y.o. [phone] at [redacted]w[redacted]d being seen today for ongoing prenatal care.  She is currently monitored for the following issues for this high-risk pregnancy and has Depression; Substance abuse (HCC); Seasonal allergies; Supervision of other normal pregnancy, antepartum; and Rh negative state in antepartum period, first trimester on their problem list.  ----------------------------------------------------------------------------------- Patient reports low back pain that radiates across back. Patient states she has been experiencing this pain intermittently during pregnancy but last night it worsened to persistent through the entire night, did not resolved with position changes. Patient has not tried anything else for pain relief at this time. Patient reports +FM, denies VB or cramping. Patient denies urinary symptoms.   Contractions: Not present. Vag. Bleeding: None.  Movement: Present. Denies leaking of fluid.  ----------------------------------------------------------------------------------- The following portions of the patient's history were reviewed and updated as appropriate: allergies, current medications, past family history, past medical history, past social history, past surgical history and problem list. Problem list updated.   Objective  Blood pressure (!) 98/58, weight 132 lb (59.9 kg), last menstrual period 11/06/2019. Pregravid weight 123 lb (55.8 kg) Total Weight Gain 9 lb (4.082 kg) Urinalysis:      Fetal Status: Fetal Heart Rate (bpm): 135   Movement: Present     General:  Alert, oriented and cooperative. Patient is in no acute distress.  Skin: Skin is warm and dry. No rash noted.   Cardiovascular: Normal heart rate noted  Respiratory: Normal respiratory effort, no problems with respiration noted  Abdomen: Soft, gravid, appropriate for gestational age. Pain/Pressure: Present     Pelvic:  Cervical exam deferred         Extremities: Normal range of motion.  Edema: None  ental Status: Normal mood and affect. Normal behavior. Normal judgment and thought content.     Assessment   25 y.o. [id number] at [redacted]w[redacted]d by  08/03/2020, by Ultrasound presenting for work-in prenatal visit  Plan   pregnancy3 Problems (from 10/14/19 to present)    Problem Noted Resolved   Supervision of other normal pregnancy, antepartum 12/17/2019 by Zipporah Plants, CNM No   Overview Addendum 04/27/2020  2:50 PM by Zipporah Plants, CNM     Nursing Staff Provider  Office Location  [facility] Dating   7w [place]  Language  English Anatomy US   Normal female - One echogenic focus noted in heart - needs repeat in third trimester  Flu Vaccine   Declined Genetic Screen  NIPS: Neg x3, xy  TDaP vaccine    Hgb A1C or  GTT Early : n/a Third trimester :   Rhogam     LAB RESULTS   Feeding Plan  Blood Type A/Negative/-- (12/16 1206)   Contraception  Antibody Negative (12/16 1206)  Circumcision  Rubella 1.84 (12/16 1206)  Pediatrician   RPR Non Reactive (12/16 1206)   Support Person  HBsAg Negative (12/16 1206)   Prenatal Classes  HIV Non Reactive (12/16 1206)    Varicella  immune  BTL Consent  n/a GBS  (For PCN allergy, check sensitivities)        VBAC Consent  n/a Pap  12/21 - [facility] - repeat 12/22    Hgb Electro      CF      SMA               Previous Version   Rh negative state in antepartum period, first trimester 12/17/2019 by Zipporah Plants, CNM  No      -Discussed pain relief techniques for back pain in pregnancy. Encouraged abdominal support, tylenol administration, epsom salt back soaks, and heat to site. Follow-up if pain persists.  Preterm labor precautions including but not limited to vaginal bleeding, contractions, leaking of fluid and fetal movement were reviewed in detail with the patient.    Keep previously scheduled follow-up.  Zipporah Plants, CNM, MSN [facility], [facility] 04/27/2020, 2:51 PM

## 2020-05-02 ENCOUNTER — Encounter: Payer: Medicaid Other | Admitting: Obstetrics

## 2020-05-02 ENCOUNTER — Other Ambulatory Visit: Payer: Self-pay

## 2020-05-02 ENCOUNTER — Other Ambulatory Visit: Payer: Medicaid Other

## 2020-05-02 ENCOUNTER — Encounter: Payer: Self-pay | Admitting: Obstetrics and Gynecology

## 2020-05-02 ENCOUNTER — Ambulatory Visit (INDEPENDENT_AMBULATORY_CARE_PROVIDER_SITE_OTHER): Payer: Medicaid Other | Admitting: Obstetrics and Gynecology

## 2020-05-02 ENCOUNTER — Encounter: Payer: Medicaid Other | Admitting: Obstetrics and Gynecology

## 2020-05-02 VITALS — BP 102/74 | Wt 133.0 lb

## 2020-05-02 DIAGNOSIS — Z6791 Unspecified blood type, Rh negative: Secondary | ICD-10-CM

## 2020-05-02 DIAGNOSIS — Z3480 Encounter for supervision of other normal pregnancy, unspecified trimester: Secondary | ICD-10-CM

## 2020-05-02 DIAGNOSIS — Z113 Encounter for screening for infections with a predominantly sexual mode of transmission: Secondary | ICD-10-CM

## 2020-05-02 DIAGNOSIS — O26891 Other specified pregnancy related conditions, first trimester: Secondary | ICD-10-CM

## 2020-05-02 DIAGNOSIS — Z3A26 26 weeks gestation of pregnancy: Secondary | ICD-10-CM

## 2020-05-02 DIAGNOSIS — Z131 Encounter for screening for diabetes mellitus: Secondary | ICD-10-CM

## 2020-05-02 NOTE — Progress Notes (Signed)
Routine Prenatal Care Visit  Subjective  Theresa Fischer is a 25 y.o. [phone] at [redacted]w[redacted]d being seen today for ongoing prenatal care.  She is currently monitored for the following issues for this low-risk pregnancy and has Depression; Substance abuse (HCC); Seasonal allergies; Supervision of other normal pregnancy, antepartum; and Rh negative state in antepartum period, first trimester on their problem list.  ----------------------------------------------------------------------------------- Patient reports no complaints.   Contractions: Not present. Vag. Bleeding: None.  Movement: Present. Leaking Fluid denies.  ----------------------------------------------------------------------------------- The following portions of the patient's history were reviewed and updated as appropriate: allergies, current medications, past family history, past medical history, past social history, past surgical history and problem list. Problem list updated.  Objective  Blood pressure 102/74, weight 133 lb (60.3 kg), last menstrual period 11/06/2019. Pregravid weight 123 lb (55.8 kg) Total Weight Gain 10 lb (4.536 kg) Urinalysis: Urine Protein    Urine Glucose    Fetal Status: Fetal Heart Rate (bpm): 140 Fundal Height: 27 cm Movement: Present     General:  Alert, oriented and cooperative. Patient is in no acute distress.  Skin: Skin is warm and dry. No rash noted.   Cardiovascular: Normal heart rate noted  Respiratory: Normal respiratory effort, no problems with respiration noted  Abdomen: Soft, gravid, appropriate for gestational age. Pain/Pressure: Absent     Pelvic:  Cervical exam deferred        Extremities: Normal range of motion.  Edema: None  Mental Status: Normal mood and affect. Normal behavior. Normal judgment and thought content.   Assessment   25 y.o. [id number] at [redacted]w[redacted]d by  08/03/2020, by Ultrasound presenting for routine prenatal visit  Plan   pregnancy3 Problems (from 10/14/19 to present)     Problem Noted Resolved   Supervision of other normal pregnancy, antepartum 12/17/2019 by Zipporah Plants, CNM No   Overview Addendum 04/27/2020  2:50 PM by Zipporah Plants, CNM     Nursing Staff Provider  Office Location  [facility] Dating   7w [place]  Language  English Anatomy US   Normal female - One echogenic focus noted in heart - needs repeat in third trimester  Flu Vaccine   Declined Genetic Screen  NIPS: Neg x3, xy  TDaP vaccine    Hgb A1C or  GTT Early : n/a Third trimester :   Rhogam     LAB RESULTS   Feeding Plan  Blood Type A/Negative/-- (12/16 1206)   Contraception  Antibody Negative (12/16 1206)  Circumcision  Rubella 1.84 (12/16 1206)  Pediatrician   RPR Non Reactive (12/16 1206)   Support Person  HBsAg Negative (12/16 1206)   Prenatal Classes  HIV Non Reactive (12/16 1206)    Varicella  immune  BTL Consent  n/a GBS  (For PCN allergy, check sensitivities)        VBAC Consent  n/a Pap  12/21 - [facility] - repeat 12/22    Hgb Electro      CF      SMA               Previous Version   Rh negative state in antepartum period, first trimester 12/17/2019 by Zipporah Plants, CNM No       Preterm labor symptoms and general obstetric precautions including but not limited to vaginal bleeding, contractions, leaking of fluid and fetal movement were reviewed in detail with the patient. Please refer to After Visit Summary for other counseling recommendations.   - 28 week labs, rhogam in 1 week. Discussed importance  of this injection.   Return in about 2 weeks (around 05/16/2020) for Routine Prenatal Appointment (F/u 1 week for rhogam injection).   Thomasene Mohair, MD, [facility], [facility] 05/02/2020 10:45 AM

## 2020-05-03 LAB — 28 WEEKS RH-PANEL
Antibody Screen: NEGATIVE
Basophils Absolute: 0 10*3/uL (ref 0.0–0.2)
Basos: 0 %
EOS (ABSOLUTE): 0.1 10*3/uL (ref 0.0–0.4)
Eos: 1 %
Gestational Diabetes Screen: 129 mg/dL (ref 65–139)
HIV Screen 4th Generation wRfx: NONREACTIVE
Hematocrit: 38.7 % (ref 34.0–46.6)
Hemoglobin: 12.8 g/dL (ref 11.1–15.9)
Immature Grans (Abs): 0.1 10*3/uL (ref 0.0–0.1)
Immature Granulocytes: 1 %
Lymphocytes Absolute: 1.5 10*3/uL (ref 0.7–3.1)
Lymphs: 14 %
MCH: 32.8 pg (ref 26.6–33.0)
MCHC: 33.1 g/dL (ref 31.5–35.7)
MCV: 99 fL — ABNORMAL HIGH (ref 79–97)
Monocytes Absolute: 0.6 10*3/uL (ref 0.1–0.9)
Monocytes: 5 %
Neutrophils Absolute: 8.6 10*3/uL — ABNORMAL HIGH (ref 1.4–7.0)
Neutrophils: 79 %
Platelets: 217 10*3/uL (ref 150–450)
RBC: 3.9 x10E6/uL (ref 3.77–5.28)
RDW: 11.7 % (ref 11.7–15.4)
RPR Ser Ql: NONREACTIVE
WBC: 10.9 10*3/uL — ABNORMAL HIGH (ref 3.4–10.8)

## 2020-05-04 ENCOUNTER — Observation Stay
Admission: EM | Admit: 2020-05-04 | Discharge: 2020-05-04 | Disposition: A | Discharge status: Home / Self Care | Payer: Medicaid Other | Attending: Obstetrics | Admitting: Obstetrics

## 2020-05-04 ENCOUNTER — Other Ambulatory Visit: Payer: Self-pay

## 2020-05-04 ENCOUNTER — Encounter: Payer: Self-pay | Admitting: Obstetrics and Gynecology

## 2020-05-04 DIAGNOSIS — O99891 Other specified diseases and conditions complicating pregnancy: Secondary | ICD-10-CM | POA: Diagnosis present

## 2020-05-04 DIAGNOSIS — O26891 Other specified pregnancy related conditions, first trimester: Secondary | ICD-10-CM

## 2020-05-04 DIAGNOSIS — O26892 Other specified pregnancy related conditions, second trimester: Secondary | ICD-10-CM | POA: Diagnosis present

## 2020-05-04 DIAGNOSIS — O99332 Smoking (tobacco) complicating pregnancy, second trimester: Secondary | ICD-10-CM | POA: Insufficient documentation

## 2020-05-04 DIAGNOSIS — U071 COVID-19: Secondary | ICD-10-CM | POA: Diagnosis not present

## 2020-05-04 DIAGNOSIS — O98512 Other viral diseases complicating pregnancy, second trimester: Secondary | ICD-10-CM | POA: Insufficient documentation

## 2020-05-04 DIAGNOSIS — Z3A27 27 weeks gestation of pregnancy: Secondary | ICD-10-CM

## 2020-05-04 DIAGNOSIS — M545 Low back pain, unspecified: Secondary | ICD-10-CM

## 2020-05-04 DIAGNOSIS — O368321 Maternal care for abnormalities of the fetal heart rate or rhythm, second trimester, fetus 1: Secondary | ICD-10-CM | POA: Diagnosis not present

## 2020-05-04 DIAGNOSIS — F17210 Nicotine dependence, cigarettes, uncomplicated: Secondary | ICD-10-CM | POA: Insufficient documentation

## 2020-05-04 DIAGNOSIS — M549 Dorsalgia, unspecified: Secondary | ICD-10-CM | POA: Diagnosis present

## 2020-05-04 DIAGNOSIS — Z3480 Encounter for supervision of other normal pregnancy, unspecified trimester: Secondary | ICD-10-CM

## 2020-05-04 DIAGNOSIS — Z6791 Unspecified blood type, Rh negative: Secondary | ICD-10-CM

## 2020-05-04 LAB — URINALYSIS, ROUTINE W REFLEX MICROSCOPIC
Bilirubin Urine: NEGATIVE
Glucose, UA: NEGATIVE mg/dL
Hgb urine dipstick: NEGATIVE
Ketones, ur: NEGATIVE mg/dL
Leukocytes,Ua: NEGATIVE
Nitrite: NEGATIVE
Protein, ur: NEGATIVE mg/dL
Specific Gravity, Urine: 1.01 (ref 1.005–1.030)
pH: 6 (ref 5.0–8.0)

## 2020-05-04 LAB — RESP PANEL BY RT-PCR (FLU A&B, COVID) ARPGX2
Influenza A by PCR: NEGATIVE
Influenza B by PCR: NEGATIVE
SARS Coronavirus 2 by RT PCR: POSITIVE — AB

## 2020-05-04 MED ORDER — CYCLOBENZAPRINE HCL 10 MG PO TABS
10.0000 mg | ORAL_TABLET | Freq: Once | ORAL | Status: AC
  Administered 2020-05-04: 10 mg via ORAL
  Filled 2020-05-04: qty 1

## 2020-05-04 MED ORDER — ACETAMINOPHEN 325 MG PO TABS
650.0000 mg | ORAL_TABLET | ORAL | Status: DC | PRN
  Administered 2020-05-04: 650 mg via ORAL
  Filled 2020-05-04: qty 2

## 2020-05-04 NOTE — Discharge Summary (Addendum)
Discharge summary  Patient ID: Theresa Fischer MRN: [id number] DOB/AGE: 06-22-1995 25 y.o.  Admit date: 05/04/2020 Admitting provider: Vena Austria, MD Discharge date: 05/04/2020   Admission Diagnoses: Back pain in pregnancy [redacted] weeks gestation of pregnancy  Discharge Diagnoses:  Active Problems:   Back pain affecting pregnancy in second trimester  Reassuring fetal heart tones COVID + status  History of Present Illness: The patient is a 25 y.o. female [phone] at [redacted]w[redacted]d who presents with a complaint of lower back pain that has been bothering her today. She has complained of back pain that comes and goes during her pregnancy. She denies any contractions; denies LOF, and reports that her baby is moving well. No recent IC.  Past Medical History:  Diagnosis Date  . Bipolar 1 disorder (HCC)   . Depression    Bipolar  . Seasonal allergies     Past Surgical History:  Procedure Laterality Date  . DILATION AND EVACUATION N/A 10/16/2016   Procedure: DILATATION AND EVACUATION;  Surgeon: Conard Novak, MD;  Location: [facility] ORS;  Service: Gynecology;  Laterality: N/A;  . MYRINGOTOMY    . NO PAST SURGERIES      No current facility-administered medications on file prior to encounter.   Current Outpatient Medications on File Prior to Encounter  Medication Sig Dispense Refill  . Prenatal Vit-Fe Fumarate-FA (MULTIVITAMIN-PRENATAL) 27-0.8 MG TABS tablet Take 1 tablet by mouth daily at 12 noon.    . ondansetron (ZOFRAN ODT) 4 MG disintegrating tablet Take 1 tablet (4 mg total) by mouth every 6 (six) hours as needed for nausea. (Patient not taking: No sig reported) 20 tablet 2    No Known Allergies  Social History   Socioeconomic History  . Marital status: Single    Spouse name: Not on file  . Number of children: Not on file  . Years of education: Not on file  . Highest education level: Not on file  Occupational History  . Not on file  Tobacco Use  . Smoking status: Current  Every Day Smoker    Packs/day: 0.20    Types: Cigarettes  . Smokeless tobacco: Never Used  . Tobacco comment: Pt states she smokes 1-2 cigarettes a day  Vaping Use  . Vaping Use: Never used  Substance and Sexual Activity  . Alcohol use: Not Currently    Comment: occ  . Drug use: No    Types: Marijuana    Comment: Last MJ use 04/10/20  . Sexual activity: Yes    Birth control/protection: Other-see comments, Surgical    Comment: Potential tubal ligation  Other Topics Concern  . Not on file  Social History Narrative  . Not on file   Social Determinants of Health   Financial Resource Strain: Not on file  Food Insecurity: Not on file  Transportation Needs: Not on file  Physical Activity: Not on file  Stress: Not on file  Social Connections: Not on file  Intimate Partner Violence: Not on file    Family History  Problem Relation Age of Onset  . CAD Mother      Review of Systems  Constitutional: Positive for malaise/fatigue.  HENT: Negative.   Eyes: Negative.   Respiratory: Negative.   Gastrointestinal: Negative.   Genitourinary: Negative.   Musculoskeletal: Positive for back pain.  Skin: Negative.   Neurological: Positive for headaches.  Endo/Heme/Allergies: Negative.   Psychiatric/Behavioral: Negative.      Physical Exam: BP 111/66 (BP Location: Left Arm)   Pulse (!) 108  Temp 98.6 F (37 C) (Oral)   Resp 16   Ht 5\' 2"  (1.575 m)   Wt 60.3 kg   LMP 11/06/2019   BMI 24.33 kg/m   Physical Exam Constitutional:      Appearance: Normal appearance. She is normal weight.  Genitourinary:     Vulva normal.     Genitourinary Comments: Pelvic deferred  HENT:     Head: Normocephalic and atraumatic.     Nose: Nose normal.  Cardiovascular:     Rate and Rhythm: Normal rate and regular rhythm.     Pulses: Normal pulses.     Heart sounds: Normal heart sounds.  Pulmonary:     Effort: Pulmonary effort is normal.     Breath sounds: Normal breath sounds.  Abdominal:      General: Bowel sounds are normal.     Palpations: Abdomen is soft.     Comments: gravid  Musculoskeletal:        General: Normal range of motion.     Cervical back: Normal range of motion and neck supple.  Neurological:     General: No focal deficit present.     Mental Status: She is alert and oriented to person, place, and time.  Skin:    General: Skin is warm and dry.  Psychiatric:        Mood and Affect: Mood normal.        Behavior: Behavior normal.     Consults: None  Significant Findings/ Diagnostic Studies: labs: COVID Positive UA- no significant findings  Procedures: NST NST Baseline FHR: 145 beats/min Variability: physiologically premature, but shows reactivity  Accelerations: present Decelerations: absent Tocometry: No contractions noted or palpated  Interpretation:  INDICATIONS: rule out uterine contractions RESULTS:  A NST procedure was performed with FHR monitoring and a normal baseline established, appropriate time of 20-40 minutes of evaluation, and accels >2 seen w 10x10 characteristics.  Results show a REACTIVE NST.    Hospital Course: The patient was admitted to Labor and Delivery Triage for observation. Fetal heart tones were monitored  and found to be reassuring. Her complaint of backpain was evalauted- a UA result did not show signs of UTI. A rapid COVID test came back positive. Her headache and back pain were treated with Tylenol and flexeril. She was instructed to return home as she did not have any difficulty breathing and her O2 SATS were 99%. She is instructed to treat any respiratory SXS with rest, fluids and Tylenol for pain. She is instructed to isolate at home during her recovery and aavoid contact with others.  Her next prenatal visit will be virtual.  She verbalizes understanding.   Discharge Condition: good  Disposition: Home  Diet: Regular diet  Discharge Activity: Bedrest and remain at home and away from contact with others while  recovering. She will f/u virtually for her next prenatal visit.   Allergies as of 05/04/2020   No Known Allergies           Total time spent taking care of this patient: 40 minutes  Signed: 07/04/2020, CNM  05/04/2020, 10:06 PM

## 2020-05-04 NOTE — OB Triage Note (Signed)
Pt is a G4P2 and [redacted]w[redacted]d presenting to L&D with lower back pain that woke her up around 0330 today. Pt states pain is bilateral and is dull and achy. She reports pain a 6/10 on a 0-10 pain scale. Pt denies urinary symptoms other than "not emptying my bladder all the way." She reported that this started today. Pt denies recent intercourse. Pt denies LOF, VB and confirms positive fetal movement. Pt took 500mg  of Tylenol at 1430 today along with a heating pad with no relief. Monitors applied and assessing.

## 2020-05-04 NOTE — OB Triage Note (Signed)
Discharge instructions reviewed and pt verbalized understanding. Prescriptions reviewed. Pt stable at time of discharge.

## 2020-05-04 NOTE — Discharge Summary (Signed)
Please see Final Progress Note  Mirna Mires, CNM  05/04/2020 10:24 PM

## 2020-05-09 ENCOUNTER — Ambulatory Visit: Payer: Medicaid Other

## 2020-05-12 ENCOUNTER — Other Ambulatory Visit: Payer: Self-pay

## 2020-05-12 ENCOUNTER — Ambulatory Visit (INDEPENDENT_AMBULATORY_CARE_PROVIDER_SITE_OTHER): Payer: Medicaid Other

## 2020-05-12 DIAGNOSIS — Z3A28 28 weeks gestation of pregnancy: Secondary | ICD-10-CM | POA: Diagnosis not present

## 2020-05-12 DIAGNOSIS — Z6791 Unspecified blood type, Rh negative: Secondary | ICD-10-CM

## 2020-05-12 DIAGNOSIS — O360930 Maternal care for other rhesus isoimmunization, third trimester, not applicable or unspecified: Secondary | ICD-10-CM

## 2020-05-12 DIAGNOSIS — O360130 Maternal care for anti-D [Rh] antibodies, third trimester, not applicable or unspecified: Secondary | ICD-10-CM | POA: Diagnosis not present

## 2020-05-12 DIAGNOSIS — O26891 Other specified pregnancy related conditions, first trimester: Secondary | ICD-10-CM | POA: Diagnosis not present

## 2020-05-12 MED ORDER — RHO D IMMUNE GLOBULIN 1500 UNIT/2ML IJ SOSY
300.0000 ug | PREFILLED_SYRINGE | Freq: Once | INTRAMUSCULAR | Status: AC
  Administered 2020-05-12: 300 ug via INTRAMUSCULAR

## 2020-05-16 ENCOUNTER — Encounter: Payer: Medicaid Other | Admitting: Obstetrics & Gynecology

## 2020-05-20 ENCOUNTER — Other Ambulatory Visit: Payer: Self-pay

## 2020-05-20 ENCOUNTER — Ambulatory Visit (INDEPENDENT_AMBULATORY_CARE_PROVIDER_SITE_OTHER): Payer: Medicaid Other | Admitting: Obstetrics

## 2020-05-20 VITALS — BP 100/60 | Wt 135.0 lb

## 2020-05-20 DIAGNOSIS — Z23 Encounter for immunization: Secondary | ICD-10-CM

## 2020-05-20 DIAGNOSIS — Z3A29 29 weeks gestation of pregnancy: Secondary | ICD-10-CM

## 2020-05-20 DIAGNOSIS — Z3480 Encounter for supervision of other normal pregnancy, unspecified trimester: Secondary | ICD-10-CM

## 2020-05-20 LAB — POCT URINALYSIS DIPSTICK OB
Glucose, UA: NEGATIVE
POC,PROTEIN,UA: NEGATIVE

## 2020-05-20 NOTE — Progress Notes (Signed)
BT Consent signed. TDAP today.

## 2020-05-20 NOTE — Progress Notes (Signed)
Routine Prenatal Care Visit  Subjective  Theresa Fischer is a 25 y.o. [phone] at [redacted]w[redacted]d being seen today for ongoing prenatal care.  She is currently monitored for the following issues for this high-risk pregnancy and has Depression; Substance abuse (HCC); Seasonal allergies; Supervision of other normal pregnancy, antepartum; Rh negative state in antepartum period, first trimester; Back pain affecting pregnancy in second trimester; and [redacted] weeks gestation of pregnancy on their problem list.  ----------------------------------------------------------------------------------- Patient reports no complaints.   Contractions: Irritability. Vag. Bleeding: None.  Movement: Present. Leaking Fluid denies.  ----------------------------------------------------------------------------------- The following portions of the patient's history were reviewed and updated as appropriate: allergies, current medications, past family history, past medical history, past social history, past surgical history and problem list. Problem list updated.  Objective  Blood pressure 100/60, weight 135 lb (61.2 kg), last menstrual period 11/06/2019. Pregravid weight 123 lb (55.8 kg) Total Weight Gain 12 lb (5.443 kg) Urinalysis: Urine Protein Negative  Urine Glucose Negative  Fetal Status:     Movement: Present     General:  Alert, oriented and cooperative. Patient is in no acute distress.  Skin: Skin is warm and dry. No rash noted.   Cardiovascular: Normal heart rate noted  Respiratory: Normal respiratory effort, no problems with respiration noted  Abdomen: Soft, gravid, appropriate for gestational age. Pain/Pressure: Present     Pelvic:  Cervical exam deferred        Extremities: Normal range of motion.     Mental Status: Normal mood and affect. Normal behavior. Normal judgment and thought content.   Assessment   25 y.o. [id number] at [redacted]w[redacted]d by  08/03/2020, by Ultrasound presenting for routine prenatal visit  Plan    pregnancy3 Problems (from 10/14/19 to present)    Problem Noted Resolved   Supervision of other normal pregnancy, antepartum 12/17/2019 by Zipporah Plants, CNM No   Overview Addendum 04/27/2020  2:50 PM by Zipporah Plants, CNM     Nursing Staff Provider  Office Location  [facility] Dating   7w [place]  Language  English Anatomy US   Normal female - One echogenic focus noted in heart - needs repeat in third trimester  Flu Vaccine   Declined Genetic Screen  NIPS: Neg x3, xy  TDaP vaccine    Hgb A1C or  GTT Early : n/a Third trimester :   Rhogam     LAB RESULTS   Feeding Plan  Blood Type A/Negative/-- (12/16 1206)   Contraception  Antibody Negative (12/16 1206)  Circumcision  Rubella 1.84 (12/16 1206)  Pediatrician   RPR Non Reactive (12/16 1206)   Support Person  HBsAg Negative (12/16 1206)   Prenatal Classes  HIV Non Reactive (12/16 1206)    Varicella  immune  BTL Consent  n/a GBS  (For PCN allergy, check sensitivities)        VBAC Consent  n/a Pap  12/21 - [facility] - repeat 12/22    Hgb Electro      CF      SMA               Previous Version   Rh negative state in antepartum period, first trimester 12/17/2019 by Zipporah Plants, CNM No       Preterm labor symptoms and general obstetric precautions including but not limited to vaginal bleeding, contractions, leaking of fluid and fetal movement were reviewed in detail with the patient. Please refer to After Visit Summary for other counseling recommendations.  tdap given today Signed consent for BTL  today.  Return in about 2 weeks (around 06/03/2020) for return OB.  Mirna Mires, CNM  05/20/2020 11:33 AM

## 2020-06-03 ENCOUNTER — Encounter: Payer: Medicaid Other | Admitting: Obstetrics

## 2020-06-13 ENCOUNTER — Encounter: Payer: Medicaid Other | Admitting: Obstetrics

## 2020-06-13 ENCOUNTER — Telehealth: Payer: Self-pay

## 2020-06-13 NOTE — Telephone Encounter (Signed)
Pt calling; had 10:50 appt; son sick throwing up; unable to come in; tried to call and was on hold for ; pls cb.  [phone]  Left msg for pt to call me back after 2pm.

## 2020-06-13 NOTE — Telephone Encounter (Signed)
LMTC

## 2020-06-14 NOTE — Telephone Encounter (Signed)
Pt is resch to 6/20th.

## 2020-06-20 ENCOUNTER — Other Ambulatory Visit: Payer: Self-pay

## 2020-06-20 ENCOUNTER — Ambulatory Visit (INDEPENDENT_AMBULATORY_CARE_PROVIDER_SITE_OTHER): Payer: Medicaid Other | Admitting: Obstetrics

## 2020-06-20 DIAGNOSIS — Z3A33 33 weeks gestation of pregnancy: Secondary | ICD-10-CM

## 2020-06-20 DIAGNOSIS — O360130 Maternal care for anti-D [Rh] antibodies, third trimester, not applicable or unspecified: Secondary | ICD-10-CM

## 2020-06-20 DIAGNOSIS — Z3480 Encounter for supervision of other normal pregnancy, unspecified trimester: Secondary | ICD-10-CM

## 2020-06-20 NOTE — Progress Notes (Signed)
Routine Prenatal Care Visit- Virtual Visit  Subjective   Virtual Visit via Telephone Note  I connected with@ on 06/20/20 at 11:10 AM EDT by telephone and verified that I am speaking with the correct person using two identifiers.   I discussed the limitations, risks, security and privacy concerns of performing an evaluation and management service by telephone and the availability of in person appointments. I also discussed with the patient that there may be a patient responsible charge related to this service. The patient expressed understanding and agreed to proceed.  The patient was at home I spoke with the patient from my  P[hone in the office The names of people involved in this encounter were: Paula Compton  CNM , and Mallie Snooks.   Theresa Fischer is a 25 y.o. [phone] at [redacted]w[redacted]d being seen today for ongoing prenatal care.  She is currently monitored for the following issues for this low-risk pregnancy and has Depression; Substance abuse (HCC); Seasonal allergies; Supervision of other normal pregnancy, antepartum; Rh negative state in antepartum period, first trimester; Back pain affecting pregnancy in second trimester; and [redacted] weeks gestation of pregnancy on their problem list.  ----------------------------------------------------------------------------------- Patient reports no bleeding, no cramping, no leaking and occasional contractions.  She is aware that she has missed several appointments.She is having a lot of [employer] contractions. Drinks a gallon of water daily. No recent intercourse. Her son is moving well, and she has named him Aeronautical engineer.  She has decided that she does NOT want a BTL,a dn is interested in getting and IUD at her 6 wk PP visit.  .  .   . Denies leaking of fluid.  ----------------------------------------------------------------------------------- The following portions of the patient's history were reviewed and updated as appropriate: allergies, current  medications, past family history, past medical history, past social history, past surgical history and problem list. Problem list updated.   Objective  Last menstrual period 11/06/2019. Pregravid weight 123 lb (55.8 kg) Total Weight Gain 12 lb (5.443 kg) Urinalysis:      Fetal Status:           Physical Exam could not be performed. Because of the COVID-19 outbreak this visit was performed over the phone and not in person.   Assessment   25 y.o. [id number] at [redacted]w[redacted]d by  08/03/2020, by Ultrasound presenting for routine prenatal visit  Plan   pregnancy3 Problems (from 10/14/19 to present)    Problem Noted Resolved   Supervision of other normal pregnancy, antepartum 12/17/2019 by Zipporah Plants, CNM No   Overview Addendum 05/20/2020 11:35 AM by Mirna Mires, CNM     Nursing Staff Provider  Office Location  [facility] Dating   7w [place]  Language  English Anatomy US   Normal female - One echogenic focus noted in heart - needs repeat in third trimester  Flu Vaccine   Declined Genetic Screen  NIPS: Neg x3, xy  TDaP vaccine   05/20/2020 Hgb A1C or  GTT Early : n/a Third trimester :   Rhogam  Yes given   LAB RESULTS   Feeding Plan  Blood Type A/Negative/-- (12/16 1206)   Contraception  BTL. Consent 5/20 Antibody Negative (12/16 1206)  Circumcision  Rubella 1.84 (12/16 1206)  Pediatrician   RPR Non Reactive (12/16 1206)   Support Person  HBsAg Negative (12/16 1206)   Prenatal Classes  HIV Non Reactive (12/16 1206)    Varicella  immune  BTL Consent  n/a GBS  (For PCN allergy, check  sensitivities)        VBAC Consent  n/a Pap  12/21 - [facility] - repeat 12/22    Hgb Electro      CF      SMA                Rh negative state in antepartum period, first trimester 12/17/2019 by Zipporah Plants, CNM No       Gestational age appropriate obstetric precautions including but not limited to vaginal bleeding, contractions, leaking of fluid and fetal movement were reviewed in detail with the patient.      Follow Up Instructions: We talked about her decision not to have a BTL. She now feels that she is too young, and would like to postpone this until she is older. Expressing interest in an IUD.   I discussed the assessment and treatment plan with the patient. The patient was provided an opportunity to ask questions and all were answered. The patient agreed with the plan and demonstrated an understanding of the instructions.     I provided 20 minutes of non-face-to-face time during this encounter.  Return in about 2 weeks (around 07/04/2020) for return OB, may do GBS culture.  Adelene Idler MD [facility], [facility] 06/20/2020 11:15 AM

## 2020-06-28 ENCOUNTER — Encounter: Payer: Medicaid Other | Admitting: Advanced Practice Midwife

## 2020-07-01 ENCOUNTER — Observation Stay
Admission: EM | Admit: 2020-07-01 | Discharge: 2020-07-02 | Disposition: A | Discharge status: Home / Self Care | Payer: Medicaid Other | Attending: Obstetrics and Gynecology | Admitting: Obstetrics and Gynecology

## 2020-07-01 DIAGNOSIS — R350 Frequency of micturition: Secondary | ICD-10-CM | POA: Insufficient documentation

## 2020-07-01 DIAGNOSIS — R3915 Urgency of urination: Secondary | ICD-10-CM | POA: Insufficient documentation

## 2020-07-01 DIAGNOSIS — Z3480 Encounter for supervision of other normal pregnancy, unspecified trimester: Secondary | ICD-10-CM

## 2020-07-01 DIAGNOSIS — O99893 Other specified diseases and conditions complicating puerperium: Principal | ICD-10-CM | POA: Insufficient documentation

## 2020-07-01 DIAGNOSIS — Z6791 Unspecified blood type, Rh negative: Secondary | ICD-10-CM

## 2020-07-01 DIAGNOSIS — O26891 Other specified pregnancy related conditions, first trimester: Secondary | ICD-10-CM

## 2020-07-01 DIAGNOSIS — Z3A35 35 weeks gestation of pregnancy: Secondary | ICD-10-CM | POA: Insufficient documentation

## 2020-07-01 DIAGNOSIS — O2343 Unspecified infection of urinary tract in pregnancy, third trimester: Secondary | ICD-10-CM

## 2020-07-02 ENCOUNTER — Other Ambulatory Visit: Payer: Self-pay

## 2020-07-02 ENCOUNTER — Encounter: Payer: Self-pay | Admitting: Obstetrics and Gynecology

## 2020-07-02 DIAGNOSIS — O99893 Other specified diseases and conditions complicating puerperium: Secondary | ICD-10-CM | POA: Diagnosis present

## 2020-07-02 DIAGNOSIS — O2343 Unspecified infection of urinary tract in pregnancy, third trimester: Secondary | ICD-10-CM | POA: Diagnosis not present

## 2020-07-02 DIAGNOSIS — Z3A35 35 weeks gestation of pregnancy: Secondary | ICD-10-CM

## 2020-07-02 DIAGNOSIS — R3915 Urgency of urination: Secondary | ICD-10-CM | POA: Diagnosis not present

## 2020-07-02 DIAGNOSIS — R350 Frequency of micturition: Secondary | ICD-10-CM | POA: Diagnosis not present

## 2020-07-02 LAB — URINALYSIS, ROUTINE W REFLEX MICROSCOPIC
Bilirubin Urine: NEGATIVE
Glucose, UA: NEGATIVE mg/dL
Hgb urine dipstick: NEGATIVE
Ketones, ur: NEGATIVE mg/dL
Nitrite: NEGATIVE
Protein, ur: NEGATIVE mg/dL
Specific Gravity, Urine: 1.003 — ABNORMAL LOW (ref 1.005–1.030)
pH: 7 (ref 5.0–8.0)

## 2020-07-02 MED ORDER — ACETAMINOPHEN 325 MG PO TABS: 650.0000 mg | ORAL_TABLET | ORAL | Status: DC | PRN

## 2020-07-02 MED ORDER — NITROFURANTOIN MONOHYD MACRO 100 MG PO CAPS: 100.0000 mg | ORAL_CAPSULE | Freq: Two times a day (BID) | ORAL | 1 refills | Status: DC

## 2020-07-02 NOTE — Discharge Summary (Signed)
Physician Final Progress Note  Patient ID: Theresa Fischer MRN: [id number] DOB/AGE: July 02, 1995 25 y.o.  Admit date: 07/01/2020 Admitting provider: Vena Austria, MD Discharge date: 07/02/2020   Admission Diagnoses: Urinary complaints  Discharge Diagnoses:  Active Problems:   Labor and delivery indication for care or intervention  25 y.o. [id number] at [redacted]w[redacted]d by Estimated Date of Delivery: 08/03/20 presenting with several week of urinary complaints but recently worsening.  She reports frequency, SUI, and urgency but occasional with no to little void volume.  She denies dysuria, hematuria, change in color  or odor of urine.  She did have COVID in May.  Some lower lumbar back pain, no CVA tenderness, fevers, or chills.  Reports irregular contractions, +FM, no LOF, no VB.  UA showed leukocytes and bacteria, possible skin contamination given number of epithelial cells but given pregnancy and symptoms Rx for macrobid provided.  Pregnancy has been uncomplicated to date.  Prior pregnancies were term deliveries 41 and 42 weeks.  Blood pressure 107/67, pulse 88, temperature 98.5 F (36.9 C), temperature source Oral, resp. rate 14, last menstrual period 11/06/2019.   Consults: None  Significant Findings/ Diagnostic Studies:  Results for orders placed or performed during the hospital encounter of 07/01/20 (from the past 24 hour(s))  Urinalysis, Routine w reflex microscopic Urine, Clean Catch     Status: Abnormal   Collection Time: 07/02/20  1:01 AM  Result Value Ref Range   Color, Urine YELLOW (A) YELLOW   APPearance CLOUDY (A) CLEAR   Specific Gravity, Urine 1.003 (L) 1.005 - 1.030   pH 7.0 5.0 - 8.0   Glucose, UA NEGATIVE NEGATIVE mg/dL   Hgb urine dipstick NEGATIVE NEGATIVE   Bilirubin Urine NEGATIVE NEGATIVE   Ketones, ur NEGATIVE NEGATIVE mg/dL   Protein, ur NEGATIVE NEGATIVE mg/dL   Nitrite NEGATIVE NEGATIVE   Leukocytes,Ua MODERATE (A) NEGATIVE   RBC / HPF 0-5 0 - 5 RBC/hpf   WBC, UA  6-10 0 - 5 WBC/hpf   Bacteria, UA MANY (A) NONE SEEN   Squamous Epithelial / LPF 21-50 0 - 5     Procedures:  Baseline: 125 Variability: moderate Accelerations: present Decelerations: absent Tocometry: irregula The patient was monitored for 30 minutes, fetal heart rate tracing was deemed reactive, category I tracing,  CPT [id number]   Discharge Condition: good  Disposition: Discharge disposition: 01-Home or Self Care       Diet: Regular diet  Discharge Activity: Activity as tolerated  Discharge Instructions     Discharge activity:  No Restrictions   Complete by: As directed    Discharge diet:  No restrictions   Complete by: As directed    Fetal Kick Count:  Lie on our left side for one hour after a meal, and count the number of times your baby kicks.  If it is less than 5 times, get up, move around and drink some juice.  Repeat the test 30 minutes later.  If it is still less than 5 kicks in an hour, notify your doctor.   Complete by: As directed    LABOR:  When conractions begin, you should start to time them from the beginning of one contraction to the beginning  of the next.  When contractions are 5 - 10 minutes apart or less and have been regular for at least an hour, you should call your health care provider.   Complete by: As directed    No sexual activity restrictions   Complete by: As directed  Notify physician for bleeding from the vagina   Complete by: As directed    Notify physician for blurring of vision or spots before the eyes   Complete by: As directed    Notify physician for chills or fever   Complete by: As directed    Notify physician for fainting spells, "black outs" or loss of consciousness   Complete by: As directed    Notify physician for increase in vaginal discharge   Complete by: As directed    Notify physician for leaking of fluid   Complete by: As directed    Notify physician for pain or burning when urinating   Complete by: As directed     Notify physician for pelvic pressure (sudden increase)   Complete by: As directed    Notify physician for severe or continued nausea or vomiting   Complete by: As directed    Notify physician for sudden gushing of fluid from the vagina (with or without continued leaking)   Complete by: As directed    Notify physician for sudden, constant, or occasional abdominal pain   Complete by: As directed    Notify physician if baby moving less than usual   Complete by: As directed       Allergies as of 07/02/2020   No Known Allergies      Medication List     TAKE these medications    multivitamin-prenatal 27-0.8 MG Tabs tablet Take 1 tablet by mouth daily at 12 noon.   nitrofurantoin (macrocrystal-monohydrate) 100 MG capsule Commonly known as: MACROBID Take 1 capsule (100 mg total) by mouth 2 (two) times daily.         Total time spent taking care of this patient: 30 minutes  Signed: Vena Austria 07/02/2020, 1:56 AM

## 2020-07-02 NOTE — Progress Notes (Signed)
Discharge instructions provided to patient. Patient verbalized understanding. Red flag signs reviewed by RN. Patient discharged home with significant other.  

## 2020-07-02 NOTE — OB Triage Note (Signed)
Patient is a 25 yo, G4P2, at 35 weeks 3 days. Patient presents with complaints of cramping and lower back pain rated 7/10. Patient denies any vaginal bleeding or LOF. Pt reports + FM. Monitors applied and assessing. VSS. Initial fetal heart tone 155. Will continue to monitor.

## 2020-07-06 ENCOUNTER — Other Ambulatory Visit: Payer: Self-pay

## 2020-07-06 ENCOUNTER — Ambulatory Visit (INDEPENDENT_AMBULATORY_CARE_PROVIDER_SITE_OTHER): Payer: Medicaid Other | Admitting: Advanced Practice Midwife

## 2020-07-06 ENCOUNTER — Other Ambulatory Visit (HOSPITAL_COMMUNITY)
Admission: RE | Admit: 2020-07-06 | Discharge: 2020-07-06 | Disposition: A | Discharge status: Home / Self Care | Payer: Medicaid Other | Source: Ambulatory Visit | Attending: Advanced Practice Midwife | Admitting: Advanced Practice Midwife

## 2020-07-06 ENCOUNTER — Encounter: Payer: Self-pay | Admitting: Advanced Practice Midwife

## 2020-07-06 VITALS — BP 109/88 | Wt 140.0 lb

## 2020-07-06 DIAGNOSIS — Z3A36 36 weeks gestation of pregnancy: Secondary | ICD-10-CM | POA: Diagnosis present

## 2020-07-06 DIAGNOSIS — Z3483 Encounter for supervision of other normal pregnancy, third trimester: Secondary | ICD-10-CM | POA: Diagnosis present

## 2020-07-06 DIAGNOSIS — Z113 Encounter for screening for infections with a predominantly sexual mode of transmission: Secondary | ICD-10-CM

## 2020-07-06 DIAGNOSIS — Z369 Encounter for antenatal screening, unspecified: Secondary | ICD-10-CM | POA: Diagnosis present

## 2020-07-06 DIAGNOSIS — Z3685 Encounter for antenatal screening for Streptococcus B: Secondary | ICD-10-CM

## 2020-07-06 NOTE — Progress Notes (Signed)
Routine Prenatal Care Visit  Subjective  Theresa Fischer is a 25 y.o. [phone] at [redacted]w[redacted]d being seen today for ongoing prenatal care.  She is currently monitored for the following issues for this low-risk pregnancy and has Depression; Substance abuse (HCC); Seasonal allergies; Supervision of other normal pregnancy, antepartum; Rh negative state in antepartum period, first trimester; Back pain affecting pregnancy in second trimester; [redacted] weeks gestation of pregnancy; Labor and delivery indication for care or intervention; and Urinary tract infection in mother during third trimester of pregnancy on their problem list.  ----------------------------------------------------------------------------------- Patient reports right side sciatica pain when sitting up from lying down.   Contractions: Not present. Vag. Bleeding: None.  Movement: Present. Leaking Fluid denies.  ----------------------------------------------------------------------------------- The following portions of the patient's history were reviewed and updated as appropriate: allergies, current medications, past family history, past medical history, past social history, past surgical history and problem list. Problem list updated.  Objective  Blood pressure 109/88, weight 140 lb (63.5 kg), last menstrual period 11/06/2019. Pregravid weight 123 lb (55.8 kg) Total Weight Gain 17 lb (7.711 kg) Urinalysis: Urine Protein    Urine Glucose    Fetal Status: Fetal Heart Rate (bpm): 137 Fundal Height: 36 cm Movement: Present     General:  Alert, oriented and cooperative. Patient is in no acute distress.  Skin: Skin is warm and dry. No rash noted.   Cardiovascular: Normal heart rate noted  Respiratory: Normal respiratory effort, no problems with respiration noted  Abdomen: Soft, gravid, appropriate for gestational age. Pain/Pressure: Present     Pelvic:  Cervical exam performed      GBS/aptima collected, 1/60/-3, posterior  Extremities: Normal  range of motion.  Edema: None  Mental Status: Normal mood and affect. Normal behavior. Normal judgment and thought content.   Assessment   25 y.o. [id number] at [redacted]w[redacted]d by  08/03/2020, by Ultrasound presenting for routine prenatal visit  Plan   pregnancy3 Problems (from 10/14/19 to present)    Problem Noted Resolved   Supervision of other normal pregnancy, antepartum 12/17/2019 by Zipporah Plants, CNM No   Overview Addendum 06/20/2020 11:41 AM by Mirna Mires, CNM     Nursing Staff Provider  Office Location  [facility] Dating   7w [place]  Language  English Anatomy US   Normal female - One echogenic focus noted in heart - needs repeat in third trimester  Flu Vaccine   Declined Genetic Screen  NIPS: Neg x3, xy  TDaP vaccine   05/20/2020 Hgb A1C or  GTT Early : n/a Third trimester :   Rhogam  Yes given   LAB RESULTS   Feeding Plan  Blood Type A/Negative/-- (12/16 1206)   Contraception  no BTL now-IUD 06/20/20 Antibody Negative (12/16 1206)  Circumcision  Rubella 1.84 (12/16 1206)  Pediatrician   RPR Non Reactive (12/16 1206)   Support Person  HBsAg Negative (12/16 1206)   Prenatal Classes  HIV Non Reactive (12/16 1206)    Varicella  immune  BTL Consent  n/a GBS  (For PCN allergy, check sensitivities)        VBAC Consent  n/a Pap  12/21 - [facility] - repeat 12/22    Hgb Electro      CF      SMA                Rh negative state in antepartum period, first trimester 12/17/2019 by Zipporah Plants, CNM No       Preterm labor symptoms and general obstetric precautions  including but not limited to vaginal bleeding, contractions, leaking of fluid and fetal movement were reviewed in detail with the patient. Please refer to After Visit Summary for other counseling recommendations.   Return in about 1 week (around 07/13/2020) for rob x3.  Tresea Mall, CNM 07/06/2020 2:20 PM

## 2020-07-08 LAB — CERVICOVAGINAL ANCILLARY ONLY
Chlamydia: NEGATIVE
Comment: NEGATIVE
Comment: NEGATIVE
Comment: NORMAL
Neisseria Gonorrhea: NEGATIVE
Trichomonas: NEGATIVE

## 2020-07-08 LAB — STREP GP B NAA: Strep Gp B NAA: NEGATIVE

## 2020-07-14 ENCOUNTER — Ambulatory Visit (INDEPENDENT_AMBULATORY_CARE_PROVIDER_SITE_OTHER): Payer: Medicaid Other | Admitting: Obstetrics

## 2020-07-14 ENCOUNTER — Other Ambulatory Visit: Payer: Self-pay

## 2020-07-14 VITALS — BP 108/76 | Wt 143.0 lb

## 2020-07-14 DIAGNOSIS — Z3483 Encounter for supervision of other normal pregnancy, third trimester: Secondary | ICD-10-CM

## 2020-07-14 DIAGNOSIS — Z3A37 37 weeks gestation of pregnancy: Secondary | ICD-10-CM

## 2020-07-14 DIAGNOSIS — Z3480 Encounter for supervision of other normal pregnancy, unspecified trimester: Secondary | ICD-10-CM

## 2020-07-14 LAB — POCT URINALYSIS DIPSTICK OB
Glucose, UA: NEGATIVE
POC,PROTEIN,UA: NEGATIVE

## 2020-07-14 NOTE — Progress Notes (Signed)
Declines cervix check today

## 2020-07-14 NOTE — Progress Notes (Signed)
Routine Prenatal Care Visit  Subjective  Theresa Fischer is a 25 y.o. [phone] at [redacted]w[redacted]d being seen today for ongoing prenatal care.  She is currently monitored for the following issues for this low-risk pregnancy and has Depression; Substance abuse (HCC); Seasonal allergies; Supervision of other normal pregnancy, antepartum; Rh negative state in antepartum period, first trimester; Back pain affecting pregnancy in second trimester; [redacted] weeks gestation of pregnancy; Labor and delivery indication for care or intervention; and Urinary tract infection in mother during third trimester of pregnancy on their problem list.  ----------------------------------------------------------------------------------- Patient reports no complaints.   Contractions: Irritability. Vag. Bleeding: None.  Movement: Present. Leaking Fluid denies.  ----------------------------------------------------------------------------------- The following portions of the patient's history were reviewed and updated as appropriate: allergies, current medications, past family history, past medical history, past social history, past surgical history and problem list. Problem list updated.  Objective  Blood pressure 108/76, weight 143 lb (64.9 kg), last menstrual period 11/06/2019. Pregravid weight 123 lb (55.8 kg) Total Weight Gain 20 lb (9.072 kg) Urinalysis: Urine Protein    Urine Glucose    Fetal Status:     Movement: Present     General:  Alert, oriented and cooperative. Patient is in no acute distress.  Skin: Skin is warm and dry. No rash noted.   Cardiovascular: Normal heart rate noted  Respiratory: Normal respiratory effort, no problems with respiration noted  Abdomen: Soft, gravid, appropriate for gestational age. Pain/Pressure: Present     Pelvic:  Cervical exam deferred        Extremities: Normal range of motion.     Mental Status: Normal mood and affect. Normal behavior. Normal judgment and thought content.   Assessment    25 y.o. [id number] at [redacted]w[redacted]d by  08/03/2020, by Ultrasound presenting for routine prenatal visit  Plan   pregnancy3 Problems (from 10/14/19 to present)    Problem Noted Resolved   Supervision of other normal pregnancy, antepartum 12/17/2019 by Zipporah Plants, CNM No   Overview Addendum 06/20/2020 11:41 AM by Mirna Mires, CNM     Nursing Staff Provider  Office Location  [facility] Dating   7w [place]  Language  English Anatomy US   Normal female - One echogenic focus noted in heart - needs repeat in third trimester  Flu Vaccine   Declined Genetic Screen  NIPS: Neg x3, xy  TDaP vaccine   05/20/2020 Hgb A1C or  GTT Early : n/a Third trimester :   Rhogam  Yes given   LAB RESULTS   Feeding Plan  Blood Type A/Negative/-- (12/16 1206)   Contraception  no BTL now-IUD 06/20/20 Antibody Negative (12/16 1206)  Circumcision  Rubella 1.84 (12/16 1206)  Pediatrician   RPR Non Reactive (12/16 1206)   Support Person  HBsAg Negative (12/16 1206)   Prenatal Classes  HIV Non Reactive (12/16 1206)    Varicella  immune  BTL Consent  n/a GBS  (For PCN allergy, check sensitivities)        VBAC Consent  n/a Pap  12/21 - [facility] - repeat 12/22    Hgb Electro      CF      SMA               Rh negative state in antepartum period, first trimester 12/17/2019 by Zipporah Plants, CNM No       Term labor symptoms and general obstetric precautions including but not limited to vaginal bleeding, contractions, leaking of fluid and fetal movement were reviewed in detail  with the patient. Please refer to After Visit Summary for other counseling recommendations.  She declined an SVE today, but will want to be checked next week.  No follow-ups on file.  Mirna Mires, CNM  07/14/2020 5:07 PM

## 2020-07-20 ENCOUNTER — Encounter: Payer: Medicaid Other | Admitting: Advanced Practice Midwife

## 2020-07-25 ENCOUNTER — Telehealth: Payer: Self-pay

## 2020-07-25 NOTE — Telephone Encounter (Signed)
Pt returned call; was up most of the night c ctxs/pressure q26m; is nauseated; pressure comes and goes; adv to go to L&D; Lawson Fiscal notified.

## 2020-07-25 NOTE — Telephone Encounter (Signed)
Pt called after hour nurse 07/24/20 8:08pm 39wks; lost her muus plug 7/23; has the urge to have BM; has extra vag pressure; mild vaginal bleeding; pain increases at night; taking tylenol; having ctxs also.  [phone]  After hour nurse adv pt to go to L&D; [facility]

## 2020-07-27 ENCOUNTER — Other Ambulatory Visit: Payer: Self-pay

## 2020-07-27 ENCOUNTER — Encounter: Payer: Self-pay | Admitting: Obstetrics & Gynecology

## 2020-07-27 ENCOUNTER — Inpatient Hospital Stay: Payer: Medicaid Other | Admitting: Anesthesiology

## 2020-07-27 ENCOUNTER — Inpatient Hospital Stay
Admission: EM | Admit: 2020-07-27 | Discharge: 2020-07-29 | DRG: 806 | Disposition: A | Discharge status: Home / Self Care | Payer: Medicaid Other | Attending: Obstetrics | Admitting: Obstetrics

## 2020-07-27 DIAGNOSIS — O99334 Smoking (tobacco) complicating childbirth: Secondary | ICD-10-CM | POA: Diagnosis present

## 2020-07-27 DIAGNOSIS — O479 False labor, unspecified: Secondary | ICD-10-CM | POA: Diagnosis present

## 2020-07-27 DIAGNOSIS — Z6791 Unspecified blood type, Rh negative: Secondary | ICD-10-CM | POA: Diagnosis not present

## 2020-07-27 DIAGNOSIS — Z3480 Encounter for supervision of other normal pregnancy, unspecified trimester: Secondary | ICD-10-CM

## 2020-07-27 DIAGNOSIS — Z3A39 39 weeks gestation of pregnancy: Secondary | ICD-10-CM | POA: Diagnosis not present

## 2020-07-27 DIAGNOSIS — D62 Acute posthemorrhagic anemia: Secondary | ICD-10-CM | POA: Diagnosis not present

## 2020-07-27 DIAGNOSIS — F17210 Nicotine dependence, cigarettes, uncomplicated: Secondary | ICD-10-CM | POA: Diagnosis present

## 2020-07-27 DIAGNOSIS — O26893 Other specified pregnancy related conditions, third trimester: Secondary | ICD-10-CM | POA: Diagnosis present

## 2020-07-27 DIAGNOSIS — O9081 Anemia of the puerperium: Secondary | ICD-10-CM | POA: Diagnosis not present

## 2020-07-27 DIAGNOSIS — O26891 Other specified pregnancy related conditions, first trimester: Secondary | ICD-10-CM

## 2020-07-27 HISTORY — DX: 27 weeks gestation of pregnancy: Z3A.27

## 2020-07-27 LAB — CBC
HCT: 36.9 % (ref 36.0–46.0)
Hemoglobin: 12.5 g/dL (ref 12.0–15.0)
MCH: 33.4 pg (ref 26.0–34.0)
MCHC: 33.9 g/dL (ref 30.0–36.0)
MCV: 98.7 fL (ref 80.0–100.0)
Platelets: 202 10*3/uL (ref 150–400)
RBC: 3.74 MIL/uL — ABNORMAL LOW (ref 3.87–5.11)
RDW: 12.5 % (ref 11.5–15.5)
WBC: 13.8 10*3/uL — ABNORMAL HIGH (ref 4.0–10.5)
nRBC: 0 % (ref 0.0–0.2)

## 2020-07-27 LAB — TYPE AND SCREEN
ABO/RH(D): A NEG
Antibody Screen: POSITIVE

## 2020-07-27 LAB — URINE DRUG SCREEN, QUALITATIVE (ARMC ONLY)
Amphetamines, Ur Screen: NOT DETECTED
Barbiturates, Ur Screen: NOT DETECTED
Benzodiazepine, Ur Scrn: NOT DETECTED
Cannabinoid 50 Ng, Ur ~~LOC~~: NOT DETECTED
Cocaine Metabolite,Ur ~~LOC~~: NOT DETECTED
MDMA (Ecstasy)Ur Screen: NOT DETECTED
Methadone Scn, Ur: NOT DETECTED
Opiate, Ur Screen: NOT DETECTED
Phencyclidine (PCP) Ur S: NOT DETECTED
Tricyclic, Ur Screen: NOT DETECTED

## 2020-07-27 MED ORDER — BENZOCAINE-MENTHOL 20-0.5 % EX AERO: 1.0000 "application " | INHALATION_SPRAY | CUTANEOUS | Status: DC | PRN

## 2020-07-27 MED ORDER — ACETAMINOPHEN 325 MG PO TABS
650.0000 mg | ORAL_TABLET | ORAL | Status: DC | PRN
  Filled 2020-07-27 (×2): qty 2

## 2020-07-27 MED ORDER — OXYCODONE-ACETAMINOPHEN 5-325 MG PO TABS: 2.0000 | ORAL_TABLET | ORAL | Status: DC | PRN

## 2020-07-27 MED ORDER — OXYTOCIN-SODIUM CHLORIDE 30-0.9 UT/500ML-% IV SOLN
INTRAVENOUS | Status: AC
  Filled 2020-07-27: qty 500

## 2020-07-27 MED ORDER — BUPIVACAINE HCL (PF) 0.25 % IJ SOLN
INTRAMUSCULAR | Status: DC | PRN
  Administered 2020-07-27: 4 mL via EPIDURAL
  Administered 2020-07-27: 2 mL via EPIDURAL

## 2020-07-27 MED ORDER — ONDANSETRON HCL 4 MG/2ML IJ SOLN: 4.0000 mg | Freq: Four times a day (QID) | INTRAMUSCULAR | Status: DC | PRN

## 2020-07-27 MED ORDER — LIDOCAINE-EPINEPHRINE (PF) 2 %-1:200000 IJ SOLN
INTRAMUSCULAR | Status: DC | PRN
  Administered 2020-07-27: 3 mL via INTRADERMAL

## 2020-07-27 MED ORDER — OXYTOCIN 10 UNIT/ML IJ SOLN
INTRAMUSCULAR | Status: AC
  Filled 2020-07-27: qty 2

## 2020-07-27 MED ORDER — TERBUTALINE SULFATE 1 MG/ML IJ SOLN: 0.2500 mg | Freq: Once | INTRAMUSCULAR | Status: DC | PRN

## 2020-07-27 MED ORDER — OXYTOCIN-SODIUM CHLORIDE 30-0.9 UT/500ML-% IV SOLN: 2.5000 [IU]/h | INTRAVENOUS | Status: DC

## 2020-07-27 MED ORDER — LACTATED RINGERS IV SOLN: INTRAVENOUS | Status: DC

## 2020-07-27 MED ORDER — ACETAMINOPHEN 325 MG PO TABS
650.0000 mg | ORAL_TABLET | ORAL | Status: DC | PRN
  Administered 2020-07-28 (×4): 650 mg via ORAL
  Filled 2020-07-27 (×4): qty 2

## 2020-07-27 MED ORDER — FENTANYL 2.5 MCG/ML BUPIVACAINE 1/10 % EPIDURAL INFUSION (WH - ANES)
INTRAMUSCULAR | Status: DC | PRN
  Administered 2020-07-27: 12 mL/h via EPIDURAL

## 2020-07-27 MED ORDER — LACTATED RINGERS IV SOLN
500.0000 mL | INTRAVENOUS | Status: DC | PRN
Start: 2020-07-27 — End: 2020-07-29
  Administered 2020-07-27: 500 mL via INTRAVENOUS

## 2020-07-27 MED ORDER — MISOPROSTOL 200 MCG PO TABS
ORAL_TABLET | ORAL | Status: AC
  Filled 2020-07-27: qty 4

## 2020-07-27 MED ORDER — SOD CITRATE-CITRIC ACID 500-334 MG/5ML PO SOLN: 30.0000 mL | ORAL | Status: DC | PRN

## 2020-07-27 MED ORDER — BENZOCAINE-MENTHOL 20-0.5 % EX AERO
INHALATION_SPRAY | CUTANEOUS | Status: AC
  Administered 2020-07-27: 1 via TOPICAL
  Filled 2020-07-27: qty 56

## 2020-07-27 MED ORDER — BUTORPHANOL TARTRATE 1 MG/ML IJ SOLN: 1.0000 mg | INTRAMUSCULAR | Status: DC | PRN

## 2020-07-27 MED ORDER — OXYCODONE-ACETAMINOPHEN 5-325 MG PO TABS
1.0000 | ORAL_TABLET | ORAL | Status: DC | PRN
  Administered 2020-07-28 – 2020-07-29 (×5): 1 via ORAL
  Filled 2020-07-27 (×5): qty 1

## 2020-07-27 MED ORDER — LIDOCAINE HCL (PF) 1 % IJ SOLN
INTRAMUSCULAR | Status: AC
  Filled 2020-07-27: qty 30

## 2020-07-27 MED ORDER — OXYTOCIN BOLUS FROM INFUSION
333.0000 mL | Freq: Once | INTRAVENOUS | Status: AC
  Administered 2020-07-27: 333 mL via INTRAVENOUS

## 2020-07-27 MED ORDER — IBUPROFEN 600 MG PO TABS
ORAL_TABLET | ORAL | Status: AC
  Administered 2020-07-28: 600 mg via ORAL
  Filled 2020-07-27: qty 1

## 2020-07-27 MED ORDER — FENTANYL-BUPIVACAINE-NACL 0.5-0.125-0.9 MG/250ML-% EP SOLN
EPIDURAL | Status: AC
  Filled 2020-07-27: qty 250

## 2020-07-27 MED ORDER — LIDOCAINE HCL (PF) 1 % IJ SOLN: 30.0000 mL | INTRAMUSCULAR | Status: DC | PRN

## 2020-07-27 MED ORDER — AMMONIA AROMATIC IN INHA
RESPIRATORY_TRACT | Status: AC
  Filled 2020-07-27: qty 10

## 2020-07-27 MED ORDER — IBUPROFEN 600 MG PO TABS
600.0000 mg | ORAL_TABLET | Freq: Four times a day (QID) | ORAL | Status: DC
  Administered 2020-07-28 – 2020-07-29 (×5): 600 mg via ORAL
  Filled 2020-07-27 (×5): qty 1

## 2020-07-27 MED ORDER — OXYTOCIN-SODIUM CHLORIDE 30-0.9 UT/500ML-% IV SOLN
1.0000 m[IU]/min | INTRAVENOUS | Status: DC
  Administered 2020-07-27: 2 m[IU]/min via INTRAVENOUS

## 2020-07-27 MED ORDER — LIDOCAINE HCL (PF) 1 % IJ SOLN
INTRAMUSCULAR | Status: DC | PRN
  Administered 2020-07-27: 3 mL

## 2020-07-27 NOTE — Progress Notes (Signed)
Theresa Fischer is a 25 y.o. [phone] at [redacted]w[redacted]d by ultrasound admitted for active labor  Subjective: she is more uncomfortabel sinceher admission. Feeling increased pelvic pressure.   Objective: BP 120/71 (BP Location: Right Arm)   Pulse 77   Temp 98.1 F (36.7 C) (Oral)   Resp 18   LMP 11/06/2019  No intake/output data recorded. No intake/output data recorded.  FHT:  FHR: 125 bpm, variability: moderate,  accelerations:  Present,  decelerations:  Absent UC:   regular, every 2-3 minutes SVE:   Dilation: 4 Effacement (%): 70 Station: -2 Exam by:: Theresa Fischer, CNM  Labs: Lab Results  Component Value Date   WBC 13.8 (H) 07/27/2020   HGB 12.5 07/27/2020   HCT 36.9 07/27/2020   MCV 98.7 07/27/2020   PLT 202 07/27/2020    Assessment / Plan: Spontaneous labor, progressing normally  Labor: Progressing normally  Fetal Wellbeing:  Category I Pain Control:  Labor support without medications I/D:  n/a Anticipated MOD:  NSVD Anesthesia contacted for epidural. Will recheck once comfortable, likely AROM.  Mirna Mires 07/27/2020, 11:09 AM

## 2020-07-27 NOTE — Progress Notes (Signed)
Theresa Fischer is a 24 y.o. [phone] at [redacted]w[redacted]d by ultrasound admitted for active labor  Subjective: She is very comfortable with an epidural. Would like to rest. The FOB is present at bedside.   Objective: BP (!) 106/59 (BP Location: Right Arm)   Pulse 79   Temp 98.3 F (36.8 C) (Oral)   Resp 17   LMP 11/06/2019   SpO2 100%  No intake/output data recorded. No intake/output data recorded.  FHT:  FHR: 125 bpm, variability: moderate,  accelerations:  Present,  decelerations:  Absent UC:   regular, every 2-3 minutes SVE:   Dilation: 4 Effacement (%): 80 Station: -1 Exam by:: Eunice Blase, CNM  Labs: Lab Results  Component Value Date   WBC 13.8 (H) 07/27/2020   HGB 12.5 07/27/2020   HCT 36.9 07/27/2020   MCV 98.7 07/27/2020   PLT 202 07/27/2020    Assessment / Plan: Spontaneous labor, progressing normally  Labor: Progressing normally; AROM performed- clear fluid seen  Fetal Wellbeing:  Category I Pain Control:  Epidural I/D:  n/a Anticipated MOD:  NSVD Will recheck in several hours. If little progress with dilation, will augment with pitocin.  Theresa Fischer 07/27/2020, 3:21 PM

## 2020-07-27 NOTE — Discharge Summary (Signed)
Postpartum Discharge Summary  Date of Service updated 07/29/2020     Patient Name: Theresa Fischer DOB: [birth year] MRN: [id number]  Date of admission: 07/27/2020 Delivery date:07/27/2020  Delivering provider: Imagene Riches  Date of discharge: 07/29/2020  Admitting diagnosis: Uterine contractions [O47.9] Intrauterine pregnancy: [redacted]w[redacted]d    Secondary diagnosis:  Principal Problem:   Supervision of other normal pregnancy, antepartum Active Problems:   Normal labor and delivery   Postpartum care following vaginal delivery  Additional problems: none    Discharge diagnosis: Term Pregnancy Delivered                                              Post partum procedures:rhogam Augmentation: AROM and Pitocin Complications: None  Hospital course: Onset of Labor With Vaginal Delivery      25y.o. yo [id number] admitted in Active Labor on 07/27/2020. Patient had an uncomplicated labor course as follows:  Membrane Rupture Time/Date: 3:05 PM ,07/27/2020   Delivery Method:Vaginal, Spontaneous  Episiotomy: None  Lacerations:  None  Patient had an uncomplicated postpartum course.  She is ambulating, tolerating a regular diet, passing flatus, and urinating well. Patient is discharged home in stable condition on 07/29/20.  Newborn Data: Birth date:07/27/2020  Birth time:9:08 PM  Gender:Female  Living status:Living  Apgars:8 ,9  Weight:3610 g   Magnesium Sulfate received: No BMZ received: No Rhophylac:Yes MMR:No T-DaP:Given prenatally Flu: No Transfusion:No  Physical exam  Vitals:   07/28/20 1138 07/28/20 1545 07/28/20 2337 07/29/20 0900  BP: 113/77 104/62 123/78 120/61  Pulse: 61 61 65 (!) 55  Resp: _0 Temp: 97.7 F (36.5 C) 97.7 F (36.5 C) 98.3 F (36.8 C) 98.3 F (36.8 C)  TempSrc: Oral Oral Oral Oral  SpO2:   98% 100%  Weight:      Height:       General: alert, cooperative, and no distress Lochia: appropriate Uterine Fundus: firm Incision:  N/A DVT Evaluation: No evidence of DVT seen on physical exam. Labs: Lab Results  Component Value Date   WBC 16.4 (H) 07/28/2020   HGB 11.0 (L) 07/28/2020   HCT 31.4 (L) 07/28/2020   MCV 96.3 07/28/2020   PLT 185 07/28/2020   CMP Latest Ref Rng & Units 03/09/2020  Glucose 70 - 99 mg/dL 82  BUN 6 - 20 mg/dL 12  Creatinine 0.44 - 1.00 mg/dL 0.62  Sodium 135 - 145 mmol/L 136  Potassium 3.5 - 5.1 mmol/L 3.7  Chloride 98 - 111 mmol/L 106  CO2 22 - 32 mmol/L 22  Calcium 8.9 - 10.3 mg/dL 9.0  Total Protein 6.5 - 8.1 g/dL 6.7  Total Bilirubin 0.3 - 1.2 mg/dL 0.5  Alkaline Phos 38 - 126 U/L 50  AST 15 - 41 U/L 21  ALT 0 - 44 U/L 19   Edinburgh Score: Edinburgh Postnatal Depression Scale Screening Tool 07/28/2020  I have been able to laugh and see the funny side of things. 0  I have looked forward with enjoyment to things. 0  I have blamed myself unnecessarily when things went wrong. 0  I have been anxious or worried for no good reason. 2  I have felt scared or panicky for no good reason. 1  Things have been getting on top of me. 0  I have been so unhappy that I have had  difficulty sleeping. 0  I have felt sad or miserable. 1  I have been so unhappy that I have been crying. 0  The thought of harming myself has occurred to me. 0  Edinburgh Postnatal Depression Scale Total 4      After visit meds:  Allergies as of 07/29/2020   No Known Allergies      Medication List     TAKE these medications    multivitamin-prenatal 27-0.8 MG Tabs tablet Take 1 tablet by mouth daily at 12 noon.         Discharge home in stable condition Infant Feeding: Bottle Infant Disposition:home with mother Discharge instruction: per After Visit Summary and Postpartum booklet. Activity: Advance as tolerated. Pelvic rest for 6 weeks.  Diet: routine diet Anticipated Birth Control: IUD Postpartum Appointment:6 weeks Additional Postpartum F/U: Postpartum Depression checkup Future  Appointments: No future appointments.  Follow up Visit:  Follow-up Information     Imagene Riches, CNM. Schedule an appointment as soon as possible for a visit in 2 week(s).   Specialties: Obstetrics, Gynecology Why: Please make an appointment  for 2 weeks for mood check after your delivery and also for 6 weeks post delivery. When you call [facility] for the 6 week postpartum appointment, tell the scheduler that you plan on getting an IUD at that appointment. Contact information: [address] [place] [address] [phone]                  07/29/2020 Rod Can, CNM

## 2020-07-27 NOTE — Progress Notes (Signed)
Theresa Fischer is a 25 y.o. [phone] at [redacted]w[redacted]d by ultrasound admitted for active labor  Subjective:Very comfortable. Denies any rectal pressure. Was abel to rest/nap. Continues to leak fluid.   Objective: BP 123/66 (BP Location: Right Arm)   Pulse 81   Temp 97.8 F (36.6 C) (Oral)   Resp 18   Ht 5\' 3"  (1.6 m)   Wt 64.9 kg   LMP 11/06/2019   SpO2 100%   BMI 25.33 kg/m  No intake/output data recorded. No intake/output data recorded.  FHT:  FHR: 125 bpm, variability: moderate,  accelerations:  Present,  decelerations:  Absent UC:   irregular, every 3-6 minutes SVE:   Dilation: 5 Effacement (%): 90 Station: -1 Exam by:: [email or web address], CNM  Labs: Lab Results  Component Value Date   WBC 13.8 (H) 07/27/2020   HGB 12.5 07/27/2020   HCT 36.9 07/27/2020   MCV 98.7 07/27/2020   PLT 202 07/27/2020    Assessment / Plan: Protracted active phase  Labor:  will augment with pitocin now  Fetal Wellbeing:  Category I Pain Control:  Epidural I/D:  n/a Anticipated MOD:  NSVD  07/29/2020 07/27/2020, 6:50 PM

## 2020-07-27 NOTE — H&P (Addendum)
Theresa Fischer is a 25 y.o. female presenting for active labor.She arrived to Labor and Delivery c/o regular contractions since earlier this morning. She denies any LOF or vaginal bleeding, and her baby has been moving well.her last examin the office was several weeks ago, and she was 1cm dilated. OB History     Gravida  4   Para  2   Term  2   Preterm  0   AB  1   Living  2      SAB  0   IAB  1   Ectopic  0   Multiple  0   Live Births  2          Past Medical History:  Diagnosis Date   Bipolar 1 disorder (HCC)    Depression    Bipolar   Seasonal allergies    Past Surgical History:  Procedure Laterality Date   DILATION AND EVACUATION N/A 10/16/2016   Procedure: DILATATION AND EVACUATION;  Surgeon: Conard Novak, MD;  Location: [facility] ORS;  Service: Gynecology;  Laterality: N/A;   MYRINGOTOMY     NO PAST SURGERIES     Family History: family history includes CAD in her mother. Social History:  reports that she has been smoking cigarettes. She has been smoking an average of .2 packs per day. She has never used smokeless tobacco. She reports previous alcohol use. She reports that she does not use drugs.     Maternal Diabetes: No Genetic Screening: Normal Maternal Ultrasounds/Referrals: Normal Fetal Ultrasounds or other Referrals:  None Maternal Substance Abuse:  Yes:  Type: Marijuana Significant Maternal Medications:  None Significant Maternal Lab Results:  Group B Strep negative and Rh negative Other Comments:  None  Review of Systems  Constitutional: Negative.   HENT: Negative.    Eyes: Negative.   Respiratory: Negative.    Cardiovascular: Negative.  Chest pain: Gravid abdomen.  Gastrointestinal: Negative.   Endocrine: Negative.   Genitourinary: Negative.   Musculoskeletal: Negative.   Neurological: Negative.   Hematological: Negative.   Psychiatric/Behavioral: Negative.    History Dilation: 3.5 Effacement (%): 70 Station: -2 Exam by::  Paula Compton CNM Blood pressure 120/71, pulse 77, temperature 98.1 F (36.7 C), temperature source Oral, resp. rate 18, last menstrual period 11/06/2019. Maternal Exam:  Uterine Assessment: Contraction strength is mild.  Contraction frequency is regular.  Abdomen: Estimated fetal weight is 6.5 lbs.   Fetal presentation: vertex Introitus: Normal vulva. Normal vagina.  Pelvis: adequate for delivery.   Cervix: Cervix evaluated by digital exam.    Physical Exam Constitutional:      Appearance: Normal appearance. She is normal weight.  HENT:     Head: Normocephalic and atraumatic.     Nose: Nose normal.  Eyes:     Extraocular Movements: Extraocular movements intact.     Pupils: Pupils are equal, round, and reactive to light.  Cardiovascular:     Rate and Rhythm: Normal rate and regular rhythm.     Pulses: Normal pulses.     Heart sounds: Normal heart sounds.  Pulmonary:     Effort: Pulmonary effort is normal.     Breath sounds: Normal breath sounds.  Abdominal:     Comments: Gravid abdomen.  Genitourinary:    General: Normal vulva.  Musculoskeletal:        General: Normal range of motion.     Cervical back: Normal range of motion and neck supple.  Skin:    General: Skin is warm and  dry.  Neurological:     General: No focal deficit present.     Mental Status: She is alert and oriented to person, place, and time.  Psychiatric:        Mood and Affect: Mood normal.        Behavior: Behavior normal.    Prenatal labs: ABO, Rh: A/Negative/-- (12/16 1206) Antibody: Negative (05/02 1045) Rubella: 1.84 (12/16 1206) RPR: Non Reactive (05/02 1045)  HBsAg: Negative (12/16 1206)  HIV: Non Reactive (05/02 1045)  GBS: Negative/-- (07/06 1600)   Assessment/Plan: IUP 39 wekks in active labor Reactive, reassuring FHTS IBOW GBS negative; RH negative  PLAN:  Admit to labor and [place] IV access EFM Routine admission labs and UDS (Hx of MJ use) Epidural planned once more  dilated. Anticipate NSVD.  Theresa Fischer, CNM  07/27/2020 9:40 AM     Theresa Fischer 07/27/2020, 9:31 AM

## 2020-07-27 NOTE — Anesthesia Preprocedure Evaluation (Addendum)
Anesthesia Evaluation  Patient identified by MRN, date of birth, ID band Patient awake    Reviewed: Allergy & Precautions, H&P , NPO status , Patient's Chart, lab work & pertinent test results, reviewed documented beta blocker date and time   Airway Mallampati: II  TM Distance: >3 FB Neck ROM: full    Dental no notable dental hx. (+) Teeth Intact   Pulmonary neg pulmonary ROS, Current Smoker,    Pulmonary exam normal breath sounds clear to auscultation       Cardiovascular Exercise Tolerance: Good negative cardio ROS   Rhythm:regular Rate:Normal     Neuro/Psych negative neurological ROS  negative psych ROS   GI/Hepatic negative GI ROS, Neg liver ROS,   Endo/Other  negative endocrine ROSdiabetes  Renal/GU      Musculoskeletal   Abdominal   Peds  Hematology negative hematology ROS (+)   Anesthesia Other Findings   Reproductive/Obstetrics (+) Pregnancy                             Anesthesia Physical Anesthesia Plan  ASA: 2  Anesthesia Plan: Epidural   Post-op Pain Management:    Induction:   PONV Risk Score and Plan:   Airway Management Planned:   Additional Equipment:   Intra-op Plan:   Post-operative Plan:   Informed Consent: I have reviewed the patients History and Physical, chart, labs and discussed the procedure including the risks, benefits and alternatives for the proposed anesthesia with the patient or authorized representative who has indicated his/her understanding and acceptance.       Plan Discussed with:   Anesthesia Plan Comments:         Anesthesia Quick Evaluation  

## 2020-07-27 NOTE — Anesthesia Procedure Notes (Signed)
Epidural Patient location during procedure: OB Start time: 07/27/2020 11:54 AM End time: 07/27/2020 12:10 PM  Staffing Anesthesiologist: Yevette Edwards, MD Resident/CRNA: Elmarie Mainland, CRNA Performed: resident/CRNA   Preanesthetic Checklist Completed: patient identified, IV checked, site marked, risks and benefits discussed, surgical consent, monitors and equipment checked, pre-op evaluation and timeout performed  Epidural Patient position: sitting Prep: ChloraPrep Patient monitoring: heart rate, continuous pulse ox and blood pressure Approach: midline Location: L3-L4 Injection technique: LOR saline  Needle:  Needle type: Tuohy  Needle gauge: 17 G Needle length: 9 cm and 9 Needle insertion depth: 5.5 cm Catheter type: closed end flexible Catheter size: 19 Gauge Catheter at skin depth: 11 cm Test dose: negative and 1.5% lidocaine with Epi 1:200 K  Assessment Sensory level: T10 Events: blood not aspirated, injection not painful, no injection resistance, no paresthesia and negative IV test  Additional Notes 1 attempt Pt. Evaluated and documentation done after procedure finished. Patient identified. Risks/Benefits/Options discussed with patient including but not limited to bleeding, infection, nerve damage, paralysis, failed block, incomplete pain control, headache, blood pressure changes, nausea, vomiting, reactions to medication both or allergic, itching and postpartum back pain. Confirmed with bedside nurse the patient's most recent platelet count. Confirmed with patient that they are not currently taking any anticoagulation, have any bleeding history or any family history of bleeding disorders. Patient expressed understanding and wished to proceed. All questions were answered. Sterile technique was used throughout the entire procedure. Please see nursing notes for vital signs. Test dose was given through epidural catheter and negative prior to continuing to dose epidural or  start infusion. Warning signs of high block given to the patient including shortness of breath, tingling/numbness in hands, complete motor block, or any concerning symptoms with instructions to call for help. Patient was given instructions on fall risk and not to get out of bed. All questions and concerns addressed with instructions to call with any issues or inadequate analgesia.    Patient tolerated the insertion well without immediate complications.Reason for block:procedure for pain

## 2020-07-27 NOTE — Progress Notes (Signed)
Innacurate BP and HR, retaken

## 2020-07-27 NOTE — Progress Notes (Signed)
Innacurate BP and HR d/t pt positioning

## 2020-07-28 ENCOUNTER — Encounter: Payer: Self-pay | Admitting: Obstetrics & Gynecology

## 2020-07-28 ENCOUNTER — Ambulatory Visit: Payer: Medicaid Other | Admitting: Advanced Practice Midwife

## 2020-07-28 LAB — CBC
HCT: 31.4 % — ABNORMAL LOW (ref 36.0–46.0)
Hemoglobin: 11 g/dL — ABNORMAL LOW (ref 12.0–15.0)
MCH: 33.7 pg (ref 26.0–34.0)
MCHC: 35 g/dL (ref 30.0–36.0)
MCV: 96.3 fL (ref 80.0–100.0)
Platelets: 185 10*3/uL (ref 150–400)
RBC: 3.26 MIL/uL — ABNORMAL LOW (ref 3.87–5.11)
RDW: 12.2 % (ref 11.5–15.5)
WBC: 16.4 10*3/uL — ABNORMAL HIGH (ref 4.0–10.5)
nRBC: 0 % (ref 0.0–0.2)

## 2020-07-28 LAB — RPR: RPR Ser Ql: NONREACTIVE

## 2020-07-28 LAB — FETAL SCREEN: Fetal Screen: NEGATIVE

## 2020-07-28 MED ORDER — ONDANSETRON HCL 4 MG/2ML IJ SOLN: 4.0000 mg | INTRAMUSCULAR | Status: DC | PRN

## 2020-07-28 MED ORDER — SIMETHICONE 80 MG PO CHEW: 80.0000 mg | CHEWABLE_TABLET | ORAL | Status: DC | PRN

## 2020-07-28 MED ORDER — ONDANSETRON HCL 4 MG PO TABS: 4.0000 mg | ORAL_TABLET | ORAL | Status: DC | PRN

## 2020-07-28 MED ORDER — ZOLPIDEM TARTRATE 5 MG PO TABS: 5.0000 mg | ORAL_TABLET | Freq: Every evening | ORAL | Status: DC | PRN

## 2020-07-28 MED ORDER — TETANUS-DIPHTH-ACELL PERTUSSIS 5-2.5-18.5 LF-MCG/0.5 IM SUSY
0.5000 mL | PREFILLED_SYRINGE | Freq: Once | INTRAMUSCULAR | Status: DC
Start: 2020-07-28 — End: 2020-07-29

## 2020-07-28 MED ORDER — DIPHENHYDRAMINE HCL 25 MG PO CAPS: 25.0000 mg | ORAL_CAPSULE | Freq: Four times a day (QID) | ORAL | Status: DC | PRN

## 2020-07-28 MED ORDER — PRENATAL MULTIVITAMIN CH
1.0000 | ORAL_TABLET | Freq: Every day | ORAL | Status: DC
  Administered 2020-07-28: 1 via ORAL
  Filled 2020-07-28: qty 1

## 2020-07-28 MED ORDER — DIBUCAINE (PERIANAL) 1 % EX OINT: 1.0000 "application " | TOPICAL_OINTMENT | CUTANEOUS | Status: DC | PRN

## 2020-07-28 MED ORDER — RHO D IMMUNE GLOBULIN 1500 UNIT/2ML IJ SOSY
300.0000 ug | PREFILLED_SYRINGE | Freq: Once | INTRAMUSCULAR | Status: AC
  Administered 2020-07-28: 300 ug via INTRAVENOUS
  Filled 2020-07-28: qty 2

## 2020-07-28 MED ORDER — DOCUSATE SODIUM 100 MG PO CAPS
100.0000 mg | ORAL_CAPSULE | Freq: Two times a day (BID) | ORAL | Status: DC
  Filled 2020-07-28: qty 1

## 2020-07-28 MED ORDER — COCONUT OIL OIL
1.0000 "application " | TOPICAL_OIL | Status: DC | PRN
  Administered 2020-07-28: 1 via TOPICAL
  Filled 2020-07-28: qty 120

## 2020-07-28 MED ORDER — WITCH HAZEL-GLYCERIN EX PADS: 1.0000 "application " | MEDICATED_PAD | CUTANEOUS | Status: DC | PRN

## 2020-07-28 NOTE — Anesthesia Postprocedure Evaluation (Signed)
Anesthesia Post Note  Patient: Theresa Fischer  Procedure(s) Performed: AN AD HOC LABOR EPIDURAL  Patient location during evaluation: Mother Baby Anesthesia Type: Epidural Level of consciousness: awake and alert Pain management: pain level controlled Vital Signs Assessment: post-procedure vital signs reviewed and stable Respiratory status: spontaneous breathing, nonlabored ventilation and respiratory function stable Cardiovascular status: stable Postop Assessment: no headache, no backache and epidural receding Anesthetic complications: no   No notable events documented.   Last Vitals:  Vitals:   07/28/20 0327 07/28/20 0750  BP: 116/61 116/76  Pulse: 83 81  Resp: 16 20  Temp: 36.7 C 36.8 C  SpO2: 99% 99%    Last Pain:  Vitals:   07/28/20 0750  TempSrc: Oral  PainSc:                  Elmarie Mainland

## 2020-07-28 NOTE — Progress Notes (Addendum)
Subjective:  Doing well postpartum Day 1: Some anxiety expressed regarding newborn care. She is tolerating regular diet. Her pain is well controlled with PO medications. She is ambulating and voiding without difficulty.  Objective:  Vital signs in last 24 hours: Temp:  [97.8 F (36.6 C)-98.4 F (36.9 C)] 98.3 F (36.8 C) (07/28 0750) Pulse Rate:  [69-203] 81 (07/28 0750) Resp:  [16-20] 20 (07/28 0750) BP: (86-129)/(55-80) 116/76 (07/28 0750) SpO2:  [95 %-100 %] 99 % (07/28 0750)    General: NAD Pulmonary: no increased work of breathing Abdomen: non-distended, non-tender, fundus firm at level of umbilicus Extremities: no edema, no erythema, no tenderness  Results for orders placed or performed during the hospital encounter of 07/27/20 (from the past 72 hour(s))  CBC     Status: Abnormal   Collection Time: 07/27/20  9:55 AM  Result Value Ref Range   WBC 13.8 (H) 4.0 - 10.5 K/uL   RBC 3.74 (L) 3.87 - 5.11 MIL/uL   Hemoglobin 12.5 12.0 - 15.0 g/dL   HCT 94.8 54.6 - 27.0 %   MCV 98.7 80.0 - 100.0 fL   MCH 33.4 26.0 - 34.0 pg   MCHC 33.9 30.0 - 36.0 g/dL   RDW 35.0 09.3 - 81.8 %   Platelets 202 150 - 400 K/uL   nRBC 0.0 0.0 - 0.2 %    Comment: Performed at [facility], [address] [place], [place] [address]  Type and screen [facility]     Status: None   Collection Time: 07/27/20  9:55 AM  Result Value Ref Range   ABO/RH(D) A NEG    Antibody Screen POS    Sample Expiration 07/30/2020,2359    Antibody Identification      PASSIVELY ACQUIRED ANTI-D Performed at [facility], [address], [place], [place] [address]   Urine Drug Screen, Qualitative ([facility] only)     Status: None   Collection Time: 07/27/20  9:55 AM  Result Value Ref Range   Tricyclic, Ur Screen NONE DETECTED NONE DETECTED   Amphetamines, Ur Screen NONE DETECTED NONE DETECTED   MDMA (Ecstasy)Ur Screen NONE DETECTED NONE DETECTED   Cocaine Metabolite,Ur [place] NONE  DETECTED NONE DETECTED   Opiate, Ur Screen NONE DETECTED NONE DETECTED   Phencyclidine (PCP) Ur S NONE DETECTED NONE DETECTED   Cannabinoid 50 Ng, Ur  NONE DETECTED NONE DETECTED   Barbiturates, Ur Screen NONE DETECTED NONE DETECTED   Benzodiazepine, Ur Scrn NONE DETECTED NONE DETECTED   Methadone Scn, Ur NONE DETECTED NONE DETECTED    Comment: (NOTE) Tricyclics + metabolites, urine    Cutoff 1000 ng/mL Amphetamines + metabolites, urine  Cutoff 1000 ng/mL MDMA (Ecstasy), urine              Cutoff 500 ng/mL Cocaine Metabolite, urine          Cutoff 300 ng/mL Opiate + metabolites, urine        Cutoff 300 ng/mL Phencyclidine (PCP), urine         Cutoff 25 ng/mL Cannabinoid, urine                 Cutoff 50 ng/mL Barbiturates + metabolites, urine  Cutoff 200 ng/mL Benzodiazepine, urine              Cutoff 200 ng/mL Methadone, urine                   Cutoff 300 ng/mL  The urine drug screen provides  only a preliminary, unconfirmed analytical test result and should not be used for non-medical purposes. Clinical consideration and professional judgment should be applied to any positive drug screen result due to possible interfering substances. A more specific alternate chemical method must be used in order to obtain a confirmed analytical result. Gas chromatography / mass spectrometry (GC/MS) is the preferred confirm atory method. Performed at [facility], [address] [place], [place] [address]   Rhogam injection     Status: None (Preliminary result)   Collection Time: 07/28/20  4:30 AM  Result Value Ref Range   Unit Number [id number]    Blood Component Type RHIG    Unit division 00    Status of Unit ISSUED    Transfusion Status      OK TO TRANSFUSE Performed at [facility], [address] [place], [place] [address]   Fetal screen     Status: None   Collection Time: 07/28/20  4:30 AM  Result Value Ref Range   Fetal Screen      NEG Performed at  [facility], [address] [place], [place] [address]   CBC     Status: Abnormal   Collection Time: 07/28/20  4:40 AM  Result Value Ref Range   WBC 16.4 (H) 4.0 - 10.5 K/uL   RBC 3.26 (L) 3.87 - 5.11 MIL/uL   Hemoglobin 11.0 (L) 12.0 - 15.0 g/dL   HCT 28.7 (L) 86.7 - 67.2 %   MCV 96.3 80.0 - 100.0 fL   MCH 33.7 26.0 - 34.0 pg   MCHC 35.0 30.0 - 36.0 g/dL   RDW 09.4 70.9 - 62.8 %   Platelets 185 150 - 400 K/uL   nRBC 0.0 0.0 - 0.2 %    Comment: Performed at [facility], [address] [place], [place] [address]    Assessment:   25 y.o. [id number] postpartum day # 1  Plan:    1) Acute blood loss anemia - hemodynamically stable and asymptomatic - po ferrous sulfate  2) Blood Type --/--/A NEG (07/27 0955) / Rubella 1.84 (12/16 1206) / Varicella Immune  3) TDAP status up to date  4) Feeding plan  formula  5)  Education given regarding options for contraception, as well as compatibility with breast feeding if applicable.  Patient plans on IUD for contraception.  6) Disposition: continue current care  7) TOC consult as needed if EPDS elevated, consider medication if needed for anxiety/depression   Tresea Mall, CNM [facility] 07/28/2020, 9:52 AM

## 2020-07-29 LAB — RHOGAM INJECTION: Unit division: 0

## 2020-07-29 NOTE — Progress Notes (Signed)
Pt discharged with infant. Discharge instructions, prescriptions, and follow up appointments given to and reviewed with patient. Pt verbalized understanding. To be escorted out by auxillary.  °

## 2020-08-24 ENCOUNTER — Ambulatory Visit: Payer: Medicaid Other | Admitting: Obstetrics

## 2020-09-26 ENCOUNTER — Ambulatory Visit: Payer: Medicaid Other | Admitting: Obstetrics

## 2020-09-26 ENCOUNTER — Telehealth: Payer: Self-pay

## 2020-09-26 NOTE — Telephone Encounter (Signed)
Patient is scheduled for mirena placement for Friday, 10/14/20 with [facility]

## 2020-09-30 NOTE — Telephone Encounter (Signed)
Noted. Will order to receive by apt date/time.

## 2020-10-14 ENCOUNTER — Ambulatory Visit: Payer: Medicaid Other | Admitting: Obstetrics

## 2020-10-14 NOTE — Telephone Encounter (Signed)
Patient No show apt.

## 2020-10-14 NOTE — Progress Notes (Deleted)
Note sent to [facility] to reach out to patinet regarding her missed appointment for 6 week PP visit with Mirena placement. Mirna Mires, CNM  10/14/2020 2:58 PM

## 2022-05-20 ENCOUNTER — Ambulatory Visit
Admission: RE | Admit: 2022-05-20 | Discharge: 2022-05-20 | Disposition: A | Discharge status: Home / Self Care | Payer: Medicaid Other | Source: Ambulatory Visit | Attending: Family Medicine | Admitting: Family Medicine

## 2022-05-20 VITALS — BP 110/69 | HR 80 | Temp 98.4°F | Resp 16 | Ht 62.0 in | Wt 104.0 lb

## 2022-05-20 DIAGNOSIS — K529 Noninfective gastroenteritis and colitis, unspecified: Secondary | ICD-10-CM | POA: Diagnosis not present

## 2022-05-20 DIAGNOSIS — B001 Herpesviral vesicular dermatitis: Secondary | ICD-10-CM

## 2022-05-20 MED ORDER — ZOVIRAX 5 % EX CREA: 1.0000 | TOPICAL_CREAM | CUTANEOUS | 0 refills | Status: AC

## 2022-05-20 MED ORDER — ONDANSETRON 4 MG PO TBDP: 4.0000 mg | ORAL_TABLET | Freq: Three times a day (TID) | ORAL | 0 refills | Status: AC | PRN

## 2022-05-20 NOTE — Discharge Instructions (Signed)
Stop by the pharmacy to pick up your prescriptions. See handout.  Follow up with your primary care provider as needed.

## 2022-05-20 NOTE — ED Triage Notes (Signed)
Pt c/o emesis,diarrhea,chills & fever x2 days. Denies any abd pain or emesis today but did have some diarrhea today. Has tried tylenol w/o relief.

## 2022-05-20 NOTE — ED Provider Notes (Signed)
[facility]    CSN: [id number] Arrival date & time: 05/20/22  1131      History   Chief Complaint Chief Complaint  Patient presents with   Fever   Emesis   Diarrhea    HPI Theresa Fischer is a 27 y.o. female.   HPI   Theresa Fischer presents for fever, sweats, chills, body aches, headaches, vomiting and diarrhea that started  about 2 days ago. She ate [employer] on Friday night.  She has ongoing headache, diarrhea. Has watery diarrhea. No one has similar sx. She had a fever blister that popped up yesterday.  She has kittens but they are at her friends house.   Had similar sx with COVID.     Fever: undocumented  Chills: yes  Sore throat: no Cough: no Chest tightness: yes but resolved  Shortness of breath: no Wheezing: no  Nasal congestion: no Rhinorrhea: no Myalgias: yes Appetite: decreased Hydration: normal Abdominal pain: no Nausea: yes Vomiting: yes Diarrhea: yes Rash: No Sleep disturbance: no Headache: yes      Past Medical History:  Diagnosis Date   [redacted] weeks gestation of pregnancy    Bipolar 1 disorder (HCC)    Depression    Bipolar   Seasonal allergies     Patient Active Problem List   Diagnosis Date Noted   Normal labor and delivery    Postpartum care following vaginal delivery    Seasonal allergies    Depression 12/10/2011   Substance abuse (HCC) 12/10/2011    Past Surgical History:  Procedure Laterality Date   DILATION AND EVACUATION N/A 10/16/2016   Procedure: DILATATION AND EVACUATION;  Surgeon: Conard Novak, MD;  Location: [facility];  Service: Gynecology;  Laterality: N/A;   MYRINGOTOMY     NO PAST SURGERIES      OB History     Gravida  4   Para  3   Term  3   Preterm  0   AB  1   Living  3      SAB  0   IAB  1   Ectopic  0   Multiple  0   Live Births  3            Home Medications    Prior to Admission medications   Medication Sig Start Date End Date Taking? Authorizing Provider   acyclovir cream (ZOVIRAX) 5 % Apply 1 Application topically every 3 (three) hours. 05/20/22  Yes Eldridge Marcott, DO  ondansetron (ZOFRAN-ODT) 4 MG disintegrating tablet Take 1 tablet (4 mg total) by mouth every 8 (eight) hours as needed. 05/20/22  Yes Saphyra Hutt, DO  SUBOXONE 8-2 MG FILM Place under the tongue daily. 04/30/22  Yes [provider]  Prenatal Vit-Fe Fumarate-FA (MULTIVITAMIN-PRENATAL) 27-0.8 MG TABS tablet Take 1 tablet by mouth daily at 12 noon.    [provider]    Family History Family History  Problem Relation Age of Onset   CAD Mother     Social History Social History   Tobacco Use   Smoking status: Every Day    Packs/day: .2    Types: Cigarettes   Smokeless tobacco: Never   Tobacco comments:    Pt states she smokes 1-2 cigarettes a day  Vaping Use   Vaping Use: Never used  Substance Use Topics   Alcohol use: Not Currently    Comment: occ   Drug use: No    Types: Marijuana    Comment: Last MJ  use 04/10/20     Allergies   Patient has no known allergies.   Review of Systems Review of Systems: negative unless otherwise stated in HPI.      Physical Exam Triage Vital Signs ED Triage Vitals  Enc Vitals Group     BP 05/20/22 1137 110/69     Pulse Rate 05/20/22 1137 80     Resp 05/20/22 1137 16     Temp 05/20/22 1137 98.4 F (36.9 C)     Temp Source 05/20/22 1137 Oral     SpO2 05/20/22 1137 100 %     Weight 05/20/22 1137 104 lb (47.2 kg)     Height 05/20/22 1137 5\' 2"  (1.575 m)     Head Circumference --      Peak Flow --      Pain Score 05/20/22 1147 4     Pain Loc --      Pain Edu? --      Excl. in GC? --    No data found.  Updated Vital Signs BP 110/69 (BP Location: Left Arm)   Pulse 80   Temp 98.4 F (36.9 C) (Oral)   Resp 16   Ht 5\' 2"  (1.575 m)   Wt 47.2 kg   SpO2 100%   BMI 19.02 kg/m   Visual Acuity Right Eye Distance:   Left Eye Distance:   Bilateral Distance:    Right Eye Near:   Left Eye  Near:    Bilateral Near:     Physical Exam GEN:     alert, non-toxic appearing female in no distress    HENT:  mucus membranes moist, herpetic appearing lesion on left upper lip margin, no oropharyngeal erythema, no tonsillar hypertrophy or exudates, no nasal discharge EYES:   pupils equal and reactive, no scleral injection or discharge NECK:  normal ROM, no lymphadenopathy, no meningismus   RESP:  no increased work of breathing, clear to auscultation bilaterally CVS:   regular rate and rhythm ABD:   Soft, nontender, nondistended, no guarding, no rebound, active bowel sounds throughout, negative McBurney's, negative Murphy's EXT:  thin, no edema  Skin:   warm and dry, no rash on visible skin    UC Treatments / Results  Labs (all labs ordered are listed, but only abnormal results are displayed) Labs Reviewed - No data to display  EKG   Radiology No results found.  Procedures Procedures (including critical care time)  Medications Ordered in UC Medications - No data to display  Initial Impression / Assessment and Plan / UC Course  I have reviewed the triage vital signs and the nursing notes.  Pertinent labs & imaging results that were available during my care of the patient were reviewed by me and considered in my medical decision making (see chart for details).       Pt is a 27 y.o. female who presents for vomiting and diarrhea that started 2 days ago. VSS.  Theresa Fischer is afebrile here without recent antipyretics. Satting well on room air. Overall pt is non-toxic appearing, well hydrated, without respiratory distress. Abdominal exam is unremarkable.  History consistent with viral vs food borne gastroenteritis . Discussed symptomatic treatment.  Zofran offered here but pt requests to pick up from pharmacy. Zofran prescribed.  Typical duration of symptoms discussed.   She has evidence of herpes labialis. Sent acyclovir cream to her pharmacy.   Return and ED precautions given and  voiced understanding. Discussed MDM, treatment plan and plan for follow-up with patient  who agrees with plan.     Final Clinical Impressions(s) / UC Diagnoses   Final diagnoses:  Gastroenteritis  Recurrent herpes labialis     Discharge Instructions      Stop by the pharmacy to pick up your prescriptions. See handout.  Follow up with your primary care provider as needed.      ED Prescriptions     Medication Sig Dispense Auth. Provider   ondansetron (ZOFRAN-ODT) 4 MG disintegrating tablet Take 1 tablet (4 mg total) by mouth every 8 (eight) hours as needed. 20 tablet Javid Kemler, DO   acyclovir cream (ZOVIRAX) 5 % Apply 1 Application topically every 3 (three) hours. 5 g Katha Cabal, DO      PDMP not reviewed this encounter.   Katha Cabal, DO 05/20/22 1241
